# Patient Record
Sex: Female | Born: 1990
Health system: Southern US, Community
[De-identification: ages and names within clinical notes are randomized; demographics above are authoritative.]

## PROBLEM LIST (undated history)

## (undated) DIAGNOSIS — L309 Dermatitis, unspecified: Secondary | ICD-10-CM

## (undated) DIAGNOSIS — D573 Sickle-cell trait: Secondary | ICD-10-CM

## (undated) DIAGNOSIS — L509 Urticaria, unspecified: Secondary | ICD-10-CM

## (undated) DIAGNOSIS — J45909 Unspecified asthma, uncomplicated: Secondary | ICD-10-CM

## (undated) HISTORY — DX: Dermatitis, unspecified: L30.9

## (undated) HISTORY — DX: Urticaria, unspecified: L50.9

## (undated) HISTORY — DX: Sickle-cell trait: D57.3

## (undated) HISTORY — PX: HERNIA REPAIR: SHX51

## (undated) HISTORY — DX: Unspecified asthma, uncomplicated: J45.909

---
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
## 2012-01-04 ENCOUNTER — Other Ambulatory Visit: Payer: BC Managed Care – PPO

## 2012-01-24 ENCOUNTER — Encounter: Payer: BC Managed Care – PPO | Admitting: Obstetrics and Gynecology

## 2014-10-20 LAB — CYTOLOGY - PAP: Pap: NEGATIVE

## 2014-11-19 ENCOUNTER — Telehealth: Payer: Self-pay

## 2014-11-19 ENCOUNTER — Ambulatory Visit (INDEPENDENT_AMBULATORY_CARE_PROVIDER_SITE_OTHER): Payer: 59 | Admitting: Sports Medicine

## 2014-11-19 VITALS — BP 132/80 | HR 80 | Temp 98.3°F | Resp 17 | Ht 63.0 in | Wt 257.6 lb

## 2014-11-19 DIAGNOSIS — G44211 Episodic tension-type headache, intractable: Secondary | ICD-10-CM

## 2014-11-19 DIAGNOSIS — J309 Allergic rhinitis, unspecified: Secondary | ICD-10-CM | POA: Insufficient documentation

## 2014-11-19 DIAGNOSIS — J45909 Unspecified asthma, uncomplicated: Secondary | ICD-10-CM

## 2014-11-19 DIAGNOSIS — J4520 Mild intermittent asthma, uncomplicated: Secondary | ICD-10-CM | POA: Insufficient documentation

## 2014-11-19 MED ORDER — PROMETHAZINE HCL 25 MG/ML IJ SOLN
25.0000 mg | Freq: Once | INTRAMUSCULAR | Status: AC
  Administered 2014-11-19: 25 mg via INTRAMUSCULAR

## 2014-11-19 MED ORDER — KETOROLAC TROMETHAMINE 30 MG/ML IJ SOLN
30.0000 mg | Freq: Once | INTRAMUSCULAR | Status: AC
  Administered 2014-11-19: 30 mg via INTRAMUSCULAR

## 2014-11-19 NOTE — Progress Notes (Signed)
Angelica HagemanKeyana Crosby - 24 y.o. female MRN [id number]  Date of birth: 01-12-1991  SUBJECTIVE:  Including CC & ROS.  Headaches: Onset of headaches: 1.5 weeks ago  Frequency of headaches: rarely gets HA she has never had one this bad before Location of headaches: frontal and occipital region Intensity of headaches: 8/10 gradually increase over the weeka Headache has been coming and going 1 or 2 episodes a days lasting for 30-1 hours. Then stop for 8-24 hours. Twice watching up at night with headache. Patient has been treating with NSAIDS that do help.  Complaints of aura: nausea with onset no vomiting, Visual aura of flashing lights or dark spots some lines in vision, No blurred or double vision  Known triggers of headache no known trigger occuring mid day mostly at work Risk factors: Family history of migraines Mother, obesity yes, overuse of OTCs ibuprofen 800mg  once a day helps coming back after, sleep disturbances normal sleep,  visual disturbances none, history of head trauma no, frequent caffeine use no Aggravated by physical activity no Associated symptoms of nausea yes, vomiting no, phonophobia and photophobia no   ROS:  Constitutional:  No fever, chills, or fatigue.  Respiratory:  No shortness of breath, cough, or wheezing Cardiovascular:  No palpitations, chest pain or syncope Gastrointestinal:  No nausea, no abdominal pain Review of systems otherwise negative except for what is stated in HPI  HISTORY: Past Medical, Surgical, Social, and Family History Reviewed & Updated per EMR. Pertinent Historical Findings include: Seasonal allergies and mild intermittent asthma  PHYSICAL EXAM:  VS: BP:132/80 mmHg  HR:80bpm  TEMP:98.3 F (36.8 C)(Oral)  RESP:98 %  HT:5\' 3"  (160 cm)   WT:257 lb 9.6 oz (116.847 kg)  BMI:45.7 PHYSICAL EXAM: General:  Alert and oriented, No acute distress.   HENT:  Normocephalic, Oral mucosa is moist.  Pupils equal reactive to light and accommodating. Normal ocular  motion in all directions. Normal vertical and horizontal saccades and no nystagmus. Normal convergence with no deficits. Normal finger to nose. Normal visual acuity. Normal retinal exam with no papilledema Respiratory:  Lungs are clear to auscultation, Respirations are non-labored, Symmetrical chest wall expansion.   Cardiovascular:  Normal rate, Regular rhythm, No murmur, Good pulses equal in all extremities, No edema.   Gastrointestinal:  Soft, Non-tender, Non-distended, Normal bowel sounds, No organomegaly.   Integumentary:  Warm, Dry, No rash.   Neurologic:  Alert, Oriented, No focal defects. Normal cranial nerves II through XII intact. upper and lower extremity strength testing sensation intact Psychiatric:  Cooperative, Appropriate mood & affect.    ASSESSMENT & PLAN:  Impression: Tension headache, intractable  Recommendations: -No red flags indicating possible stroke, pseudotumor cerebri, cluster headache, migraine headache, or head trauma with concussion. -Advised patient that there is no obvious triggers for her headache and it would be helpful for her to keep a headache diary to help classify the trigger of her symptoms. -To treat her acute headache today will treat patient with IM Toradol 30 mg and Phenergan 25 mg. Patient will receive a ride home from her sister. -First patient to sleep throughout the evening tonight and possibly to rest tomorrow. If symptoms continue despite up in office for reassessment and possible referral for slit-lamp exam.  Headache counseling recommendations: -Recommended eliminating over-the-counter abortive therapy such as ibuprofen and Tylenol as he skin causes frequent rebound headaches.  Encourage patients to only use these as needed and infrequently using them more than once or twice a day the rebound headaches are more likely  to occur. -Recommended eliminating caffeine use as this can be a common trigger for headaches -Recommended getting adequate  sleep at night as poor sleep can trigger headaches -Recommended maintaining a migraine headache diary so that patterns of potential triggers can be identified

## 2014-11-19 NOTE — Patient Instructions (Signed)
Headache counseling recommendations: -Recommended eliminating over-the-counter abortive therapy such as ibuprofen and Tylenol as he skin causes frequent rebound headaches.  Encourage patients to only use these as needed and infrequently using them more than once or twice a day the rebound headaches are more likely to occur. -Recommended eliminating caffeine use as this can be a common trigger for headaches -Recommended getting adequate sleep at night as poor sleep can trigger headaches -Recommended maintaining a migraine headache diary so that patterns of potential triggers can be identified

## 2015-04-13 NOTE — Telephone Encounter (Signed)
No mgs

## 2015-08-14 ENCOUNTER — Ambulatory Visit (INDEPENDENT_AMBULATORY_CARE_PROVIDER_SITE_OTHER): Payer: BLUE CROSS/BLUE SHIELD | Admitting: Family Medicine

## 2015-08-14 VITALS — BP 122/84 | HR 97 | Temp 99.8°F | Resp 18 | Wt 266.6 lb

## 2015-08-14 DIAGNOSIS — J101 Influenza due to other identified influenza virus with other respiratory manifestations: Secondary | ICD-10-CM | POA: Diagnosis not present

## 2015-08-14 DIAGNOSIS — J029 Acute pharyngitis, unspecified: Secondary | ICD-10-CM | POA: Diagnosis not present

## 2015-08-14 DIAGNOSIS — R6889 Other general symptoms and signs: Secondary | ICD-10-CM

## 2015-08-14 LAB — POCT INFLUENZA A/B
Influenza A, POC: POSITIVE — AB
Influenza B, POC: NEGATIVE

## 2015-08-14 LAB — POCT RAPID STREP A (OFFICE): Rapid Strep A Screen: NEGATIVE

## 2015-08-14 MED ORDER — HYDROCODONE-HOMATROPINE 5-1.5 MG/5ML PO SYRP: 5.0000 mL | ORAL_SOLUTION | Freq: Every evening | ORAL | Status: DC | PRN

## 2015-08-14 MED ORDER — OSELTAMIVIR PHOSPHATE 75 MG PO CAPS: 75.0000 mg | ORAL_CAPSULE | Freq: Two times a day (BID) | ORAL | Status: DC

## 2015-08-14 NOTE — Progress Notes (Signed)
      Chief Complaint:  Chief Complaint  Patient presents with  . Flu like symptoms    x2 days    HPI: Angelica Crosby is a 25 y.o. female who reports to [facility] today complaining of 2 day history of flu like sxs , fatigue and soughing, sinu pressure, no fevers or chills , +/- msk aches. Di dnot get flu vaccine. Has tried otc meds without relief, some thing called best defense vitamins. She has ahd flu befpore and feels like it.   History reviewed. No pertinent past medical history. History reviewed. No pertinent past surgical history. Social History   Social History  . Marital Status: Single    Spouse Name: N/A  . Number of Children: N/A  . Years of Education: N/A   Social History Main Topics  . Smoking status: Never Smoker   . Smokeless tobacco: None  . Alcohol Use: None  . Drug Use: None  . Sexual Activity: Not Asked   Other Topics Concern  . None   Social History Narrative   History reviewed. No pertinent family history. No Known Allergies Prior to Admission medications   Not on File     ROS: The patient denies fevers, chills, night sweats, unintentional weight loss, chest pain, palpitations, wheezing, dyspnea on exertion, nausea, vomiting, abdominal pain, dysuria, hematuria, melena, numbness, or tingling.   All other systems have been reviewed and were otherwise negative with the exception of those mentioned in the HPI and as above.    PHYSICAL EXAM: Filed Vitals:   08/14/15 1548  BP: 122/84  Pulse: 97  Temp: 99.8 F (37.7 C)  Resp: 18   Body mass index is 47.24 kg/(m^2).   General: Alert, no acute distress HEENT:  Normocephalic, atraumatic, oropharynx patent. EOMI, PERRLA Erythematous throat, no exudates, TM normal, + frontal sinus tenderness, + erythematous/boggy nasal mucosa Cardiovascular:  Regular rate and rhythm, no rubs murmurs or gallops.  No Carotid bruits, radial pulse intact. No pedal edema.  Respiratory: Clear to auscultation bilaterally.  No  wheezes, rales, or rhonchi.  No cyanosis, no use of accessory musculature Abdominal: No organomegaly, abdomen is soft and non-tender, positive bowel sounds. No masses. Skin: No rashes. Neurologic: Facial musculature symmetric. Psychiatric: Patient acts appropriately throughout our interaction. Lymphatic: No cervical or submandibular lymphadenopathy Musculoskeletal: Gait intact. No edema, tenderness   LABS: Results for orders placed or performed in visit on 08/14/15  POCT Influenza A/B  Result Value Ref Range   Influenza A, POC Positive (A) Negative   Influenza B, POC Negative Negative  POCT rapid strep A  Result Value Ref Range   Rapid Strep A Screen Negative Negative     EKG/XRAY:   Primary read interpreted by Dr. Conley Rolls at [facility].   ASSESSMENT/PLAN: Encounter Diagnoses  Name Primary?  . Acute pharyngitis, unspecified pharyngitis type Yes  . Flu-like symptoms   . Influenza A    Rx tamiflu Rx hycodan, did not want any tessalone perels, will get otc daytime cough meds Work note given  Fu prn   Gross sideeffects, risk and benefits, and alternatives of medications d/w patient. Patient is aware that all medications have potential sideeffects and we are unable to predict every sideeffect or drug-drug interaction that may occur.  Doni Widmer DO  08/14/2015 6:07 PM

## 2015-08-14 NOTE — Patient Instructions (Signed)
Influenza, Adult Influenza (flu) is an infection in the mouth, nose, and throat (respiratory tract) caused by a virus. The flu can make you feel very ill. Influenza spreads easily from person to person (contagious).  HOME CARE   Only take medicines as told by your doctor.  Use a cool mist humidifier to make breathing easier.  Get plenty of rest until your fever goes away. This usually takes 3 to 4 days.  Drink enough fluids to keep your pee (urine) clear or pale yellow.  Cover your mouth and nose when you cough or sneeze.  Wash your hands well to avoid spreading the flu.  Stay home from work or school until your fever has been gone for at least 1 full day.  Get a flu shot every year. GET HELP RIGHT AWAY IF:   You have trouble breathing or feel short of breath.  Your skin or nails turn blue.  You have severe neck pain or stiffness.  You have a severe headache, facial pain, or earache.  Your fever gets worse or keeps coming back.  You feel sick to your stomach (nauseous), throw up (vomit), or have watery poop (diarrhea).  You have chest pain.  You have a deep cough that gets worse, or you cough up more thick spit (mucus). MAKE SURE YOU:   Understand these instructions.  Will watch your condition.  Will get help right away if you are not doing well or get worse.   This information is not intended to replace advice given to you by your health care provider. Make sure you discuss any questions you have with your health care provider.   Document Released: 03/15/2008 Document Revised: 06/27/2014 Document Reviewed: 09/05/2011 Elsevier Interactive Patient Education 2016 Elsevier Inc.  

## 2015-12-05 ENCOUNTER — Ambulatory Visit (INDEPENDENT_AMBULATORY_CARE_PROVIDER_SITE_OTHER): Payer: BLUE CROSS/BLUE SHIELD | Admitting: Emergency Medicine

## 2015-12-05 VITALS — BP 122/80 | HR 75 | Temp 97.9°F | Resp 12 | Ht 62.0 in | Wt 267.0 lb

## 2015-12-05 DIAGNOSIS — L02429 Furuncle of limb, unspecified: Secondary | ICD-10-CM

## 2015-12-05 MED ORDER — CLINDAMYCIN HCL 300 MG PO CAPS: 300.0000 mg | ORAL_CAPSULE | Freq: Three times a day (TID) | ORAL | Status: DC

## 2015-12-05 NOTE — Patient Instructions (Addendum)
Apply warm compresses elbow. If you have worsening of symptoms of progressive swelling please return to clinic    IF you received an x-ray today, you will receive an invoice from [facility]. Please contact [facility] at [phone] with questions or concerns regarding your invoice.   IF you received labwork today, you will receive an invoice from [facility]. Please contact [facility] at [phone] with questions or concerns regarding your invoice.   Our billing staff will not be able to assist you with questions regarding bills from these companies.  You will be contacted with the lab results as soon as they are available. The fastest way to get your results is to activate your My Chart account. Instructions are located on the last page of this paperwork. If you have not heard from us regarding the results in 2 weeks, please contact this office.     Abscess An abscess is an infected area that contains a collection of pus and debris.It can occur in almost any part of the body. An abscess is also known as a furuncle or boil. CAUSES  An abscess occurs when tissue gets infected. This can occur from blockage of oil or sweat glands, infection of hair follicles, or a minor injury to the skin. As the body tries to fight the infection, pus collects in the area and creates pressure under the skin. This pressure causes pain. People with weakened immune systems have difficulty fighting infections and get certain abscesses more often.  SYMPTOMS Usually an abscess develops on the skin and becomes a painful mass that is red, warm, and tender. If the abscess forms under the skin, you may feel a moveable soft area under the skin. Some abscesses break open (rupture) on their own, but most will continue to get worse without care. The infection can spread deeper into the body and eventually into the bloodstream, causing you to feel ill.  DIAGNOSIS  Your caregiver  will take your medical history and perform a physical exam. A sample of fluid may also be taken from the abscess to determine what is causing your infection. TREATMENT  Your caregiver may prescribe antibiotic medicines to fight the infection. However, taking antibiotics alone usually does not cure an abscess. Your caregiver may need to make a small cut (incision) in the abscess to drain the pus. In some cases, gauze is packed into the abscess to reduce pain and to continue draining the area. HOME CARE INSTRUCTIONS   Only take over-the-counter or prescription medicines for pain, discomfort, or fever as directed by your caregiver.  If you were prescribed antibiotics, take them as directed. Finish them even if you start to feel better.  If gauze is used, follow your caregiver's directions for changing the gauze.  To avoid spreading the infection:  Keep your draining abscess covered with a bandage.  Wash your hands well.  Do not share personal care items, towels, or whirlpools with others.  Avoid skin contact with others.  Keep your skin and clothes clean around the abscess.  Keep all follow-up appointments as directed by your caregiver. SEEK MEDICAL CARE IF:   You have increased pain, swelling, redness, fluid drainage, or bleeding.  You have muscle aches, chills, or a general ill feeling.  You have a fever. MAKE SURE YOU:   Understand these instructions.  Will watch your condition.  Will get help right away if you are not doing well or get worse.   This information is not intended to replace  advice given to you by your health care provider. Make sure you discuss any questions you have with your health care provider.   Document Released: 03/16/2005 Document Revised: 12/06/2011 Document Reviewed: 08/19/2011 Elsevier Interactive Patient Education [employer].

## 2015-12-05 NOTE — Progress Notes (Signed)
By signing my name below, I, Mesha Guinyard, attest that this documentation has been prepared under the direction and in the presence of Lesle ChrisSteven Shariah Assad, MD.  Electronically Signed: Arvilla MarketMesha Guinyard, Medical Scribe. 12/05/2015. 8:36 AM.  Chief Complaint:  Chief Complaint  Patient presents with  . Recurrent Skin Infections    boil on left elbow, 2days swollen and red    HPI: Angelica Crosby is a 25 y.o. female [facility] complaining of nodule on top of her left elbow 2 days ago. The nodule is described as erythematous, swollen, and painful. Pt doesn't remember getting bit. Pt has had a boil on her thigh and her arm in the past, but never on her elbow. Pt report having a fever today.  Pt is sexually active and wears condoms for birth control. Pt denies possibility of being pregnant. LMP was nl last week. Pt works at [employer].  Past Medical History  Diagnosis Date  . Asthma    Past Surgical History  Procedure Laterality Date  . Hernia repair     Social History   Social History  . Marital Status: Single    Spouse Name: N/A  . Number of Children: N/A  . Years of Education: N/A   Social History Main Topics  . Smoking status: Never Smoker   . Smokeless tobacco: None  . Alcohol Use: None  . Drug Use: None  . Sexual Activity: Not Asked   Other Topics Concern  . None   Social History Narrative   No family history on file. No Known Allergies Prior to Admission medications   Medication Sig Start Date End Date Taking? Authorizing Provider  albuterol (PROVENTIL, VENTOLIN) (5 MG/ML) 0.5% NEBU Take 2.5 mg/hr by nebulization continuous.   Yes Historical Provider, MD     ROS: The patient denies chills, night sweats, unintentional weight loss, chest pain, palpitations, wheezing, dyspnea on exertion, nausea, vomiting, abdominal pain, dysuria, hematuria, melena, numbness, weakness, or tingling. +fevers  All other systems have been reviewed and were  otherwise negative with the exception of those mentioned in the HPI and as above.    PHYSICAL EXAM: Filed Vitals:   12/05/15 0829  BP: 122/80  Pulse: 75  Temp: 97.9 F (36.6 C)  Resp: 12   Body mass index is 48.82 kg/(m^2).   General: Alert & cooperative, no acute distress HEENT:  Normocephalic, atraumatic, oropharynx patent. Eye: [facility] Cardiovascular:  Regular rate and rhythm, no rubs murmurs or gallops.  No Carotid bruits, radial pulse intact. No pedal edema.  Respiratory: Clear to auscultation bilaterally.  No wheezes, rales, or rhonchi.  No cyanosis, no use of accessory musculature Abdominal: No organomegaly, abdomen is soft and non-tender, positive bowel sounds.  No masses. Musculoskeletal: Gait intact. No edema, tenderness Skin: No rashes. 1x1 cm tender firm nodule over the left olecranon with mild redness around nodule this does not appear to be an olecranon bursitus Neurologic: Facial musculature symmetric. Psychiatric: Patient acts appropriately throughout our interaction. Lymphatic: No cervical or submandibular lymphadenopathy  LABS:    EKG/XRAY:   [facility]   ASSESSMENT/PLAN: This did not appear to be an olecranon bursitis. She has a 1 x 1 cm firm area over the olecranon bursa with some surrounding redness and increased warmth. This would be consistent with a boil. She has a history of boils. Because she is a young female will treat with Cleocin. Instructions given to return to clinic if  worsening.I personally performed the services described in this documentation, which was scribed in my presence. The recorded information has been reviewed and is accurate.   Gross sideeffects, risk and benefits, and alternatives of medications d/w patient. Patient is aware that all medications have potential sideeffects and we are unable to predict every sideeffect or drug-drug interaction that may occur.  Lesle Chris MD 12/05/2015 8:36  AM

## 2016-01-08 ENCOUNTER — Ambulatory Visit (INDEPENDENT_AMBULATORY_CARE_PROVIDER_SITE_OTHER): Payer: BLUE CROSS/BLUE SHIELD | Admitting: Family Medicine

## 2016-01-08 VITALS — BP 122/76 | HR 87 | Temp 97.6°F | Resp 20 | Ht 62.0 in | Wt 263.0 lb

## 2016-01-08 DIAGNOSIS — Z7251 High risk heterosexual behavior: Secondary | ICD-10-CM | POA: Diagnosis not present

## 2016-01-08 DIAGNOSIS — Z0000 Encounter for general adult medical examination without abnormal findings: Secondary | ICD-10-CM | POA: Diagnosis not present

## 2016-01-08 DIAGNOSIS — B373 Candidiasis of vulva and vagina: Secondary | ICD-10-CM

## 2016-01-08 DIAGNOSIS — N898 Other specified noninflammatory disorders of vagina: Secondary | ICD-10-CM

## 2016-01-08 DIAGNOSIS — N3090 Cystitis, unspecified without hematuria: Secondary | ICD-10-CM | POA: Diagnosis not present

## 2016-01-08 DIAGNOSIS — J309 Allergic rhinitis, unspecified: Secondary | ICD-10-CM | POA: Diagnosis not present

## 2016-01-08 DIAGNOSIS — R05 Cough: Secondary | ICD-10-CM | POA: Diagnosis not present

## 2016-01-08 DIAGNOSIS — R059 Cough, unspecified: Secondary | ICD-10-CM

## 2016-01-08 DIAGNOSIS — B3731 Acute candidiasis of vulva and vagina: Secondary | ICD-10-CM

## 2016-01-08 LAB — POCT CBC
Granulocyte percent: 58.9 %G (ref 37–80)
HCT, POC: 41.3 % (ref 37.7–47.9)
Hemoglobin: 14 g/dL (ref 12.2–16.2)
Lymph, poc: 1.8 (ref 0.6–3.4)
MCH, POC: 26 pg — AB (ref 27–31.2)
MCHC: 33.8 g/dL (ref 31.8–35.4)
MCV: 76.9 fL — AB (ref 80–97)
MID (cbc): 0.5 (ref 0–0.9)
MPV: 6.8 fL (ref 0–99.8)
POC Granulocyte: 3.3 (ref 2–6.9)
POC LYMPH PERCENT: 32.1 %L (ref 10–50)
POC MID %: 9 %M (ref 0–12)
Platelet Count, POC: 334 10*3/uL (ref 142–424)
RBC: 5.37 M/uL (ref 4.04–5.48)
RDW, POC: 15.2 %
WBC: 5.6 10*3/uL (ref 4.6–10.2)

## 2016-01-08 LAB — POCT URINE PREGNANCY: Preg Test, Ur: NEGATIVE

## 2016-01-08 LAB — POC MICROSCOPIC URINALYSIS (UMFC): Mucus: ABSENT

## 2016-01-08 LAB — POCT URINALYSIS DIP (MANUAL ENTRY)
Bilirubin, UA: NEGATIVE
Glucose, UA: NEGATIVE
Ketones, POC UA: NEGATIVE
Nitrite, UA: NEGATIVE
Protein Ur, POC: NEGATIVE
Spec Grav, UA: 1.02
Urobilinogen, UA: 0.2
pH, UA: 6

## 2016-01-08 LAB — POCT WET + KOH PREP
Trich by wet prep: ABSENT
Yeast by wet prep: ABSENT

## 2016-01-08 LAB — POCT GLYCOSYLATED HEMOGLOBIN (HGB A1C): Hemoglobin A1C: 5.8

## 2016-01-08 LAB — GLUCOSE, POCT (MANUAL RESULT ENTRY): POC Glucose: 97 mg/dl (ref 70–99)

## 2016-01-08 MED ORDER — FLUCONAZOLE 150 MG PO TABS: 150.0000 mg | ORAL_TABLET | Freq: Once | ORAL | Status: DC

## 2016-01-08 MED ORDER — CEPHALEXIN 500 MG PO CAPS: 500.0000 mg | ORAL_CAPSULE | Freq: Two times a day (BID) | ORAL | Status: DC

## 2016-01-08 MED ORDER — MONTELUKAST SODIUM 10 MG PO TABS: 10.0000 mg | ORAL_TABLET | Freq: Every day | ORAL | Status: DC

## 2016-01-08 NOTE — Progress Notes (Signed)
Patient ID: Angelica HagemanKeyana Crosby, female    DOB: 1990/09/26  Age: 25 y.o. MRN: [id number]  Chief Complaint  Patient presents with  . Annual Exam    With Pap  . Sinus Problem    Subjective:   Patient is here for a physical examination.  She had unprotected sex weeks ago. They did use a condom initially, then he took it off and they continued having sex. She has had about 10 partners in her lifetime. No pregnancies. No history of STDs. It was this and this encounter that encouraged her to come on in for a physical today also. She is worried that she might pick up something. She has had some vaginal irritation.  Past medical history: Medications: None Allergies: Multiple environmental allergies including animals, pollens, dust, etc. practices she put down" everything" on the form) Medical history: Allergies, asthma, sickle cell trait Surgeries: Hernia repair in infancy Last menstrual cycle about 12 days ago  Family history: Mother father Angelica Crosby are living and well with no major illnesses. There is cancer, diabetes, stroke, and minimal disease in her grandparents.  Social history: Does not smoke. Drinks about once a week. Does not use drugs. She is single, sexually involved with men only. 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 She has a associate degree and one more year of college.  Review of systems: Constitutional: She's had some chills sweating and fever. She HEENT includes congestion and sinus pressure and sneezing and sore throat. Eyes unremarkable Respiratory: Cough Cardiovascular: Unremarkable Gastrointestinal: Diarrhea : Increased urination Genitourinary has urinary frequency, vaginal discharge, vaginal pain. Musculoskeletal unremarkable Dermatologic: Unremarkable Allergy/immunology: Seasonal allergies Neurologic: Headaches Hematologic: Unremarkable Psychiatric: Unremarkable   Current allergies, medications, problem list, past/family and social  histories reviewed.  Objective:  BP 122/76 mmHg  Pulse 87  Temp(Src) 97.6 F (36.4 C) (Oral)  Resp 20  Ht 5\' 2"  (1.575 m)  Wt 263 lb (119.296 kg)  BMI 48.09 kg/m2  SpO2 100%  LMP 12/30/2015  Physical exam: Overweight obese young lady in no major acute distress. TMs normal. Eyes PERRLA. Throat clear. Neck supple without nodes or thyromegaly. Teeth look good. Chest clear to auscultation. Heart regular without murmurs. Breasts not examined, denies any issues. Abdomen soft without mass or tenderness. Normal female external genitalia. Labia separate readily. No lesions noted externally. Normal vaginal mucosa. Has some cheesy discharge. Wet prep taken. She bled a little bit with doing the Pap test. Bimanual unremarkable. Extremities without lesions or rashes. Skin unremarkable.  Assessment & Plan:   Assessment: 1. Annual physical exam   2. Unprotected sexual intercourse   3. Morbid obesity, unspecified obesity type (HCC)   4. Vaginal discharge   5. Allergic rhinitis, unspecified allergic rhinitis type   6. Cough   7. Cystitis       Plan: Annual physical plus sinusitis problems and the issue of her sexual exposure.  Orders Placed This Encounter  Procedures  . HIV antibody  . RPR  . COMPLETE METABOLIC PANEL WITH GFR  . TSH  . POCT CBC  . POCT urine pregnancy  . POCT urinalysis dipstick  . POCT Microscopic Urinalysis (UMFC)  . POCT Wet + KOH Prep  . POCT glycosylated hemoglobin (Hb A1C)  . POCT glucose (manual entry)    Meds ordered this encounter  Medications  . Cetirizine HCl (ZYRTEC PO)    Sig: Take by mouth.  . montelukast (SINGULAIR) 10 MG tablet    Sig: Take 1 tablet (10  mg total) by mouth at bedtime.    Dispense:  30 tablet    Refill:  3  . cephALEXin (KEFLEX) 500 MG capsule    Sig: Take 1 capsule (500 mg total) by mouth 2 (two) times daily.    Dispense:  10 capsule    Refill:  0    Results for orders placed or performed in visit on 01/08/16  POCT CBC   Result Value Ref Range   WBC 5.6 4.6 - 10.2 K/uL   Lymph, poc 1.8 0.6 - 3.4   POC LYMPH PERCENT 32.1 10 - 50 %L   MID (cbc) 0.5 0 - 0.9   POC MID % 9.0 0 - 12 %M   POC Granulocyte 3.3 2 - 6.9   Granulocyte percent 58.9 37 - 80 %G   RBC 5.37 4.04 - 5.48 M/uL   Hemoglobin 14.0 12.2 - 16.2 g/dL   HCT, POC 81.1 91.4 - 47.9 %   MCV 76.9 (A) 80 - 97 fL   MCH, POC 26.0 (A) 27 - 31.2 pg   MCHC 33.8 31.8 - 35.4 g/dL   RDW, POC 78.2 %   Platelet Count, POC 334 142 - 424 K/uL   MPV 6.8 0 - 99.8 fL  POCT urine pregnancy  Result Value Ref Range   Preg Test, Ur Negative Negative  POCT urinalysis dipstick  Result Value Ref Range   Color, UA yellow yellow   Clarity, UA hazy (A) clear   Glucose, UA negative negative   Bilirubin, UA negative negative   Ketones, POC UA negative negative   Spec Grav, UA 1.020    Blood, UA trace-lysed (A) negative   pH, UA 6.0    Protein Ur, POC negative negative   Urobilinogen, UA 0.2    Nitrite, UA Negative Negative   Leukocytes, UA moderate (2+) (A) Negative  POCT Microscopic Urinalysis (UMFC)  Result Value Ref Range   WBC,UR,HPF,POC Moderate (A) None WBC/hpf   RBC,UR,HPF,POC None None RBC/hpf   Bacteria Moderate (A) None, Too numerous to count   Mucus Absent Absent   Epithelial Cells, UR Per Microscopy Moderate (A) None, Too numerous to count cells/hpf  POCT Wet + KOH Prep  Result Value Ref Range   Yeast by KOH Present Present, Absent   Yeast by wet prep Absent Present, Absent   WBC by wet prep Moderate (A) None, Few, Too numerous to count   Clue Cells Wet Prep HPF POC Few (A) None, Too numerous to count   Trich by wet prep Absent Present, Absent   Bacteria Wet Prep HPF POC Moderate (A) None, Few, Too numerous to count   Epithelial Cells By [employer] (UMFC) Many (A) None, Few, Too numerous to count   RBC,UR,HPF,POC None None RBC/hpf  POCT glycosylated hemoglobin (Hb A1C)  Result Value Ref Range   Hemoglobin A1C 5.8   POCT glucose (manual entry)   Result Value Ref Range   POC Glucose 97 70 - 99 mg/dl        Patient Instructions   Drink plenty of water to keep your bladder well flushed out  Take the antibiotic cephalexin one twice daily for urinary infection   If you choose to have sex, use detection 100% of the time  If you do not have a. When you're scheduled to, you should check another pregnancy test.  Once you have had a menstrual period and no you are not pregnant you should consider going on a birth control, such as birth  control pills or an implant. The safest thing is to not have sexual involvement until you are in a lifelong 1 man/1 woman relationship, and if you're choosing to be sexually involved it is better to go ahead and be on something to prevent pregnancy.  You need to work hard on trying to eat less and exercise more to lose weight  We will let you know the remainder of your labs in a few days.  Take the Zyrtec as you have been.  Take Singulair (montelukast) 1 daily for your allergies and asthma    IF you received an x-ray today, you will receive an invoice from [facility]. Please contact [facility] at [phone] with questions or concerns regarding your invoice.   IF you received labwork today, you will receive an invoice from [facility]. Please contact [facility] at [phone] with questions or concerns regarding your invoice.   Our billing staff will not be able to assist you with questions regarding bills from these companies.  You will be contacted with the lab results as soon as they are available. The fastest way to get your results is to activate your My Chart account. Instructions are located on the last page of this paperwork. If you have not heard from [place] regarding the results in 2 weeks, please contact this office.         No Follow-up on file.   HOPPER,DAVID, MD 01/08/2016

## 2016-01-08 NOTE — Progress Notes (Signed)
Patient is here complaining of her sinuses and congestion. Also has dysuria  Subjective: For the past few days especially she's had a lot of sinus congestion and coughing. She coughs up some greenish mucus. Most of what she blows her nose is clear. She has not been running a fever.  Objective: TMs are normal. Throat clear. Neck supple without nodes. Chest clear to auscultation. Her nose is very stuffy.  Assessment: Urinary tract infection Probably bad allergic issues with the current environmental air alerts  Plan: Treat symptomatically. Singulair for allergy and asthma Cephalexin for the UTI

## 2016-01-08 NOTE — Patient Instructions (Addendum)
Drink plenty of water to keep your bladder well flushed out  Take the antibiotic cephalexin one twice daily for urinary infection   If you choose to have sex, use detection 100% of the time  If you do not have a. When you're scheduled to, you should check another pregnancy test.  Once you have had a menstrual period and no you are not pregnant you should consider going on a birth control, such as birth control pills or an implant. The safest thing is to not have sexual involvement until you are in a lifelong 1 man/1 woman relationship, and if you're choosing to be sexually involved it is better to go ahead and be on something to prevent pregnancy. You can call the office and make an appointment with the PA Sarah or with Dr. Norberto SorensonEva Shaw, both of whom can put in implants.  You need to work hard on trying to eat less and exercise more to lose weight  We will let you know the remainder of your labs in a few days.  Take the Zyrtec as you have been.  Take Singulair (montelukast) 1 daily for your allergies and asthma  Take Diflucan single dose pill for yeast tinnitus    IF you received an x-ray today, you will receive an invoice from [facility]. Please contact [facility] at [phone] with questions or concerns regarding your invoice.   IF you received labwork today, you will receive an invoice from [facility]. Please contact [facility] at [phone] with questions or concerns regarding your invoice.   Our billing staff will not be able to assist you with questions regarding bills from these companies.  You will be contacted with the lab results as soon as they are available. The fastest way to get your results is to activate your My Chart account. Instructions are located on the last page of this paperwork. If you have not heard from us regarding the results in 2 weeks, please contact this office.

## 2016-01-09 LAB — TSH: TSH: 1.93 mIU/L

## 2016-01-09 LAB — COMPLETE METABOLIC PANEL WITH GFR
ALT: 10 U/L (ref 6–29)
AST: 15 U/L (ref 10–30)
Albumin: 4.1 g/dL (ref 3.6–5.1)
Alkaline Phosphatase: 148 U/L — ABNORMAL HIGH (ref 33–115)
BUN: 11 mg/dL (ref 7–25)
CO2: 29 mmol/L (ref 20–31)
Calcium: 9.4 mg/dL (ref 8.6–10.2)
Chloride: 104 mmol/L (ref 98–110)
Creat: 0.97 mg/dL (ref 0.50–1.10)
GFR, Est African American: >89 mL/min (ref >=60)
GFR, Est Non African American: 83 mL/min (ref >=60)
Glucose, Bld: 96 mg/dL (ref 65–99)
Potassium: 4.5 mmol/L (ref 3.5–5.3)
Sodium: 142 mmol/L (ref 135–146)
Total Bilirubin: 0.2 mg/dL (ref 0.2–1.2)
Total Protein: 7.2 g/dL (ref 6.1–8.1)

## 2016-01-09 LAB — HIV ANTIBODY (ROUTINE TESTING W REFLEX): HIV 1&2 Ab, 4th Generation: NONREACTIVE

## 2016-01-10 LAB — URINE CULTURE

## 2016-01-11 ENCOUNTER — Telehealth: Payer: Self-pay | Admitting: Emergency Medicine

## 2016-01-11 NOTE — Telephone Encounter (Signed)
Attempted to reach pt with results Mailbox full

## 2016-01-11 NOTE — Telephone Encounter (Signed)
-----   Message from Peyton Najjar, MD sent at 01/11/2016  8:16 AM EDT ----- Call patient: Labs normal Urine grew strept infection which should respond well to the antibiotic she is taking.

## 2016-01-12 ENCOUNTER — Encounter: Payer: Self-pay | Admitting: *Deleted

## 2016-01-12 LAB — PAP IG, CT-NG, RFX HPV ASCU
Chlamydia Probe Amp: DETECTED — AB
GC Probe Amp: NOT DETECTED

## 2016-01-12 LAB — RPR

## 2016-02-02 ENCOUNTER — Encounter: Payer: Self-pay | Admitting: Family Medicine

## 2016-02-02 ENCOUNTER — Ambulatory Visit: Payer: Self-pay

## 2016-02-02 ENCOUNTER — Ambulatory Visit (INDEPENDENT_AMBULATORY_CARE_PROVIDER_SITE_OTHER): Payer: BLUE CROSS/BLUE SHIELD | Admitting: Family Medicine

## 2016-02-02 VITALS — BP 108/74 | HR 79 | Temp 98.3°F | Resp 16 | Ht 62.25 in | Wt 268.4 lb

## 2016-02-02 DIAGNOSIS — Z111 Encounter for screening for respiratory tuberculosis: Secondary | ICD-10-CM

## 2016-02-02 NOTE — Progress Notes (Signed)
  Tuberculosis Risk Questionnaire  1. No Were you born outside the [place] in one of the following parts of the world: [place], [place], [place], [place] or [place]?    2. No Have you traveled outside the [place] and lived for more than one month in one of the following parts of the world: [place], [place], [place], [place] or [place]?    3. No Do you have a compromised immune system such as from any of the following conditions:HIV/AIDS, organ or bone marrow transplantation, diabetes, immunosuppressive medicines (e.g. Prednisone, Remicaide), leukemia, lymphoma, cancer of the head or neck, gastrectomy or jejunal bypass, end-stage renal disease (on dialysis), or silicosis?     4. No Have you ever or do you plan on working in: a residential care center, a health care facility, a jail or prison or homeless shelter?    5. No Have you ever: injected illegal drugs, used crack cocaine, lived in a homeless shelter  or been in jail or prison?     6. No Have you ever been exposed to anyone with infectious tuberculosis?    Tuberculosis Symptom Questionnaire  Do you currently have any of the following symptoms?  1. No Unexplained cough lasting more than 3 weeks?   2. No Unexplained fever lasting more than 3 weeks.   3. No Night Sweats (sweating that leaves the bedclothes and sheets wet)     4. No Shortness of Breath   5. No Chest Pain   6. No Unintentional weight loss    7. No Unexplained fatigue (very tired for no reason)    Signed,   Meredith StaggersJeffrey Krista Godsil, MD [facility].  02/02/16 9:39 AM

## 2016-02-02 NOTE — Progress Notes (Signed)
Subjective:  By signing my name below, I, Stann Oresung-Kai Tsai, attest that this documentation has been prepared under the direction and in the presence of Meredith StaggersJeffrey Karie Skowron, MD. Electronically Signed: Stann Oresung-Kai Tsai, Scribe. 02/02/2016 , 9:36 AM .  Patient was seen in Room 9 .   Patient ID: Angelica Crosby, female    DOB: [birth year], 25 y.o.   MRN: [id number] Chief Complaint  Patient presents with  . PPD Reading    here for ppd for daycare   HPI Angelica Crosby is a 25 y.o. female Here for PPD reading. She had a physical on July 21st. She works at [employer] but plans to work at [employer], which requires a TB skin test. Her last TB screening was in 2014 and 2015.   Patient Active Problem List   Diagnosis Date Noted  . Allergic rhinitis 11/19/2014  . Airway hyperreactivity 11/19/2014   Past Medical History:  Diagnosis Date  . Asthma    Past Surgical History:  Procedure Laterality Date  . HERNIA REPAIR     Not on File Prior to Admission medications   Medication Sig Start Date End Date Taking? Authorizing Provider  albuterol (PROVENTIL, VENTOLIN) (5 MG/ML) 0.5% NEBU Take 2.5 mg/hr by nebulization continuous. Reported on 01/08/2016   Yes Historical Provider, MD  Cetirizine HCl (ZYRTEC PO) Take by mouth.   Yes Historical Provider, MD  montelukast (SINGULAIR) 10 MG tablet Take 1 tablet (10 mg total) by mouth at bedtime. 01/08/16  Yes Peyton Najjaravid H Hopper, MD  cephALEXin (KEFLEX) 500 MG capsule Take 1 capsule (500 mg total) by mouth 2 (two) times daily. Patient not taking: Reported on 02/02/2016 01/08/16   Peyton Najjaravid H Hopper, MD  clindamycin (CLEOCIN) 300 MG capsule Take 1 capsule (300 mg total) by mouth 3 (three) times daily. Patient not taking: Reported on 01/08/2016 12/05/15   Collene GobbleSteven A Daub, MD  fluconazole (DIFLUCAN) 150 MG tablet Take 1 tablet (150 mg total) by mouth once. Patient not taking: Reported on 02/02/2016 01/08/16   Peyton Najjaravid H Hopper, MD   Social History   Social History  . Marital status: Single    Spouse name: N/A  . Number of children: N/A  . Years of education: N/A   Occupational History  . Not on file.   Social History Main Topics  . Smoking status: Never Smoker  . Smokeless tobacco: Not on file  . Alcohol use Not on file  . Drug use: Unknown  . Sexual activity: Not on file   Other Topics Concern  . Not on file   Social History Narrative  . No narrative on file   Review of Systems  Constitutional: Negative for chills, fatigue, fever and unexpected weight change.  Respiratory: Negative for cough.   Gastrointestinal: Negative for constipation, diarrhea, nausea and vomiting.  Skin: Negative for rash and wound.  Neurological: Negative for dizziness, weakness and headaches.       Objective:   Physical Exam  Constitutional: She is oriented to person, place, and time. She appears well-developed and well-nourished. No distress.  HENT:  Head: Normocephalic and atraumatic.  Eyes: EOM are normal. Pupils are equal, round, and reactive to light.  Neck: Neck supple.  Cardiovascular: Normal rate, regular rhythm and normal heart sounds.  Exam reveals no gallop and no friction rub.   No murmur heard. Pulmonary/Chest: Effort normal and breath sounds normal. No respiratory distress.  Musculoskeletal: Normal range of motion.  Neurological: She is alert and oriented to person, place, and time.  Skin: Skin  is warm and dry. No rash noted. No erythema.  Psychiatric: She has a normal mood and affect. Her behavior is normal.  Nursing note and vitals reviewed.   Vitals:   02/02/16 0910  BP: 108/74  Pulse: 79  Resp: 16  Temp: 98.3 F (36.8 C)  TempSrc: Oral  SpO2: 99%  Weight: 268 lb 6.4 oz (121.7 kg)  Height: 5' 2.25" (1.581 m)      Assessment & Plan:    Angelica Crosby is a 25 y.o. female Screening for tuberculosis - Plan: TB Skin Test  - No concerns for active TB based on questionnaire, but due to need for actual PPD for work, this was placed today. Return in 48-72  hours to have this read.  No orders of the defined types were placed in this encounter.  Patient Instructions     IF you received an x-ray today, you will receive an invoice from [facility]. Please contact [facility] at [phone] with questions or concerns regarding your invoice.   IF you received labwork today, you will receive an invoice from [facility]. Please contact [facility] at [phone] with questions or concerns regarding your invoice.   Our billing staff will not be able to assist you with questions regarding bills from these companies.  You will be contacted with the lab results as soon as they are available. The fastest way to get your results is to activate your My Chart account. Instructions are located on the last page of this paperwork. If you have not heard from us regarding the results in 2 weeks, please contact this office.    Based on screening questionnaire, I do not have any concerns for tuberculosis, but will also place TB skin test as required by work. Return in 48-72 hours to have that test read.    I personally performed the services described in this documentation, which was scribed in my presence. The recorded information has been reviewed and considered, and addended by me as needed.   Signed,   Meredith StaggersJeffrey Jakylah Bassinger, MD [facility].  02/02/16 10:02 AM

## 2016-02-02 NOTE — Patient Instructions (Addendum)
   IF you received an x-ray today, you will receive an invoice from [facility]. Please contact [facility] at [phone] with questions or concerns regarding your invoice.   IF you received labwork today, you will receive an invoice from [facility]. Please contact [facility] at [phone] with questions or concerns regarding your invoice.   Our billing staff will not be able to assist you with questions regarding bills from these companies.  You will be contacted with the lab results as soon as they are available. The fastest way to get your results is to activate your My Chart account. Instructions are located on the last page of this paperwork. If you have not heard from us regarding the results in 2 weeks, please contact this office.    Based on screening questionnaire, I do not have any concerns for tuberculosis, but will also place TB skin test as required by work. Return in 48-72 hours to have that test read.

## 2016-02-04 ENCOUNTER — Ambulatory Visit (INDEPENDENT_AMBULATORY_CARE_PROVIDER_SITE_OTHER): Payer: BLUE CROSS/BLUE SHIELD | Admitting: Physician Assistant

## 2016-02-04 DIAGNOSIS — Z111 Encounter for screening for respiratory tuberculosis: Secondary | ICD-10-CM

## 2016-02-04 LAB — TB SKIN TEST: TB Skin Test: NEGATIVE

## 2016-02-04 NOTE — Progress Notes (Signed)
Pt. Was here for a PPD read. Results negative. Induration 0mm. Patient was given a copy of her results as well as had additional paperwork pertaining on her physical on 01/08/16 filled out. Copies were made of all paperwork.

## 2016-08-04 ENCOUNTER — Telehealth: Payer: Self-pay | Admitting: General Practice

## 2016-08-04 NOTE — Telephone Encounter (Signed)
Pt's sister,Alexia Raul Dellston  is your pt, .  Pt would like to know if you will accept her as a pt as well?

## 2016-08-05 NOTE — Telephone Encounter (Signed)
Sorry but I am too full right now  

## 2016-08-17 NOTE — Telephone Encounter (Signed)
Have called several times.  Unable to leave message due to no voice mail.

## 2017-03-23 ENCOUNTER — Emergency Department (HOSPITAL_COMMUNITY)
Admission: EM | Admit: 2017-03-23 | Discharge: 2017-03-23 | Disposition: A | Discharge status: Home / Self Care | Payer: 59 | Attending: Emergency Medicine | Admitting: Emergency Medicine

## 2017-03-23 ENCOUNTER — Encounter (HOSPITAL_COMMUNITY): Payer: Self-pay | Admitting: Emergency Medicine

## 2017-03-23 ENCOUNTER — Emergency Department (HOSPITAL_COMMUNITY): Payer: 59

## 2017-03-23 DIAGNOSIS — Z79899 Other long term (current) drug therapy: Secondary | ICD-10-CM | POA: Insufficient documentation

## 2017-03-23 DIAGNOSIS — J45909 Unspecified asthma, uncomplicated: Secondary | ICD-10-CM | POA: Insufficient documentation

## 2017-03-23 DIAGNOSIS — R0789 Other chest pain: Secondary | ICD-10-CM | POA: Insufficient documentation

## 2017-03-23 LAB — BASIC METABOLIC PANEL
Anion gap: 6 (ref 5–15)
BUN: 7 mg/dL (ref 6–20)
CO2: 27 mmol/L (ref 22–32)
Calcium: 8.9 mg/dL (ref 8.9–10.3)
Chloride: 104 mmol/L (ref 101–111)
Creatinine, Ser: 0.86 mg/dL (ref 0.44–1.00)
GFR calc Af Amer: >60 mL/min (ref >60)
GFR calc non Af Amer: >60 mL/min (ref >60)
Glucose, Bld: 101 mg/dL — ABNORMAL HIGH (ref 65–99)
Potassium: 4.4 mmol/L (ref 3.5–5.1)
Sodium: 137 mmol/L (ref 135–145)

## 2017-03-23 LAB — CBC
HCT: 39 % (ref 36.0–46.0)
Hemoglobin: 12.8 g/dL (ref 12.0–15.0)
MCH: 25.5 pg — ABNORMAL LOW (ref 26.0–34.0)
MCHC: 32.8 g/dL (ref 30.0–36.0)
MCV: 77.8 fL — ABNORMAL LOW (ref 78.0–100.0)
Platelets: 303 10*3/uL (ref 150–400)
RBC: 5.01 MIL/uL (ref 3.87–5.11)
RDW: 15 % (ref 11.5–15.5)
WBC: 5.7 10*3/uL (ref 4.0–10.5)

## 2017-03-23 LAB — I-STAT TROPONIN, ED: Troponin i, poc: 0 ng/mL (ref 0.00–0.08)

## 2017-03-23 MED ORDER — IBUPROFEN 600 MG PO TABS: 600.0000 mg | ORAL_TABLET | Freq: Four times a day (QID) | ORAL | 0 refills | Status: DC | PRN

## 2017-03-23 NOTE — ED Triage Notes (Signed)
Per pt, pt complains of chest pain for the last four days and SOB when bending over or laying down. Pt states the chest pain is aching and comes and goes throughout the day.

## 2017-03-23 NOTE — Discharge Instructions (Signed)
Take ibuprofen as needed for pain.  Return if you develop pain with eating, vomiting, fever, or coughing up blood.

## 2017-03-23 NOTE — ED Provider Notes (Signed)
[facility] Provider Note   CSN: [id number] Arrival date & time: 03/23/17  8295     History   Chief Complaint Chief Complaint  Patient presents with  . Chest Pain  . Shortness of Breath    HPI Angelica Crosby is a 26 y.o. female.  HPI   26 year old female with history of asthma presenting complaining of chest pain and shortness of breath. For the past 4 days patient has had intermittent pain to her mid chest and her right lower chest. Pain is described as a sharp shooting sensation, worse with bending over or with laying down. When the pain is intense a catch her breath away. Pain is been intermittent throughout the day. She reported occasional nonproductive cough, and feeling nauseous. She denies fever, chills, lightheadedness, dizziness, diaphoresis, vomiting, diarrhea, or rash. She does admits to heavy lifting at work but that's not new. Denies any other strenuous activities. No prior history of PE or DVT, no recent surgery, prolonged bed rest, taking oral hormone, or hemoptysis.  Past Medical History:  Diagnosis Date  . Asthma     Patient Active Problem List   Diagnosis Date Noted  . Allergic rhinitis 11/19/2014  . Airway hyperreactivity 11/19/2014    Past Surgical History:  Procedure Laterality Date  . HERNIA REPAIR      OB History    No data available       Home Medications    Prior to Admission medications   Medication Sig Start Date End Date Taking? Authorizing Provider  albuterol (PROVENTIL HFA;VENTOLIN HFA) 108 (90 Base) MCG/ACT inhaler Inhale 2 puffs into the lungs as needed for wheezing or shortness of breath.   Yes [provider]  ibuprofen (ADVIL,MOTRIN) 200 MG tablet Take 400 mg by mouth every 6 (six) hours as needed for moderate pain or cramping.   Yes [provider]  montelukast (SINGULAIR) 10 MG tablet Take 1 tablet (10 mg total) by mouth at bedtime. 01/08/16  Yes Peyton Najjar, MD    Family History History  reviewed. No pertinent family history.  Social History Social History  Substance Use Topics  . Smoking status: Never Smoker  . Smokeless tobacco: Never Used  . Alcohol use 0.0 oz/week     Allergies   Amoxicillin and Penicillins   Review of Systems Review of Systems  All other systems reviewed and are negative.    Physical Exam Updated Vital Signs BP 118/70   Pulse 69   Temp 98.5 F (36.9 C) (Oral)   Resp 17   Ht  (1.6 m)   Wt 108.9 kg (240 lb)   LMP 03/18/2017 (Exact Date)   SpO2 99%   BMI 42.51 kg/m   Physical Exam  Constitutional: She is oriented to person, place, and time. She appears well-developed and well-nourished. No distress.  Obese female resting topically, laughing, in no acute discomfort.  HENT:  Head: Atraumatic.  Right Ear: External ear normal.  Left Ear: External ear normal.  Mouth/Throat: Oropharynx is clear and moist.  Eyes: Conjunctivae are normal.  Neck: Normal range of motion. Neck supple. No tracheal deviation present.  Cardiovascular: Normal rate and regular rhythm.   Pulmonary/Chest: Effort normal and breath sounds normal. No stridor. She exhibits tenderness (Mild tenderness to mid chest on palpation without crepitus or emphysema.).  Abdominal: Soft.  Musculoskeletal: She exhibits no edema.  Neurological: She is alert and oriented to person, place, and time.  Skin: No rash noted.  Psychiatric: She has a normal mood  and affect.  Nursing note and vitals reviewed.    ED Treatments / Results  Labs (all labs ordered are listed, but only abnormal results are displayed) Labs Reviewed  BASIC METABOLIC PANEL - Abnormal; Notable for the following:       Result Value   Glucose, Bld 101 (*)    All other components within normal limits  CBC - Abnormal; Notable for the following:    MCV 77.8 (*)    MCH 25.5 (*)    All other components within normal limits  I-STAT TROPONIN, ED    EKG  EKG Interpretation None      Date:  03/23/2017  Rate: 79  Rhythm: normal sinus rhythm  QRS Axis: normal  Intervals: normal  ST/T Wave abnormalities: normal  Conduction Disutrbances: none  Narrative Interpretation:   Old EKG Reviewed: No significant changes noted     Radiology Dg Chest 2 View  Result Date: 03/23/2017 CLINICAL DATA:  Mid and right-sided chest pain for the past 3 days associated with shortness of breath. History of asthma. EXAM: CHEST  2 VIEW COMPARISON:  None in PACs FINDINGS: The lungs are adequately inflated. There is no focal infiltrate. The right infrahilar lung markings are coarse posteriorly. There is no pleural effusion. The heart and pulmonary vascularity are normal. The mediastinum is normal in width. The bony thorax is unremarkable. IMPRESSION: Probable subsegmental atelectasis in the right lower lobe. No definite pneumonia nor other acute cardiopulmonary abnormality. If the patient's symptoms persist, a repeat chest x-ray would be a useful next imaging step. Electronically Signed   By: David  [place] M.D.   On: 03/23/2017 09:49    Procedures Procedures (including critical care time)  Medications Ordered in ED Medications - No data to display   Initial Impression / Assessment and Plan / ED Course  I have reviewed the triage vital signs and the nursing notes.  Pertinent labs & imaging results that were available during my care of the patient were reviewed by me and considered in my medical decision making (see chart for details).     BP 118/70   Pulse 69   Temp 98.5 F (36.9 C) (Oral)   Resp 17   Ht  (1.6 m)   Wt 108.9 kg (240 lb)   LMP 03/18/2017 (Exact Date)   SpO2 99%   BMI 42.51 kg/m    Final Clinical Impressions(s) / ED Diagnoses   Final diagnoses:  Atypical chest pain    New Prescriptions Current Discharge Medication List     1:06 PM Pt here with reproducible chest wall pain, worse with position change.  Pain atypical for ACS.  HEART score of 1, low risk of  MACE.  PERC negative, doubt PE.  Reassurance given, pt stable for discharge.  Her CXR shows probably subsegmental atelectasis in RLL without definitive pneumonia.  Her sxs not suggestive of pna.  Recommend repeat CXR if sxs persists.  Currently low suspicion for biliary disease however I gave pt s/s to look for and to return.    Fayrene Helper, PA-C 03/23/17 1343    Cathren Laine, MD 03/24/17 [phone]

## 2017-07-08 ENCOUNTER — Emergency Department (HOSPITAL_COMMUNITY)
Admission: EM | Admit: 2017-07-08 | Discharge: 2017-07-08 | Disposition: A | Discharge status: Home / Self Care | Payer: 59 | Attending: Emergency Medicine | Admitting: Emergency Medicine

## 2017-07-08 ENCOUNTER — Other Ambulatory Visit: Payer: Self-pay

## 2017-07-08 ENCOUNTER — Encounter (HOSPITAL_COMMUNITY): Payer: Self-pay | Admitting: *Deleted

## 2017-07-08 DIAGNOSIS — J45909 Unspecified asthma, uncomplicated: Secondary | ICD-10-CM | POA: Diagnosis not present

## 2017-07-08 DIAGNOSIS — S61314A Laceration without foreign body of right ring finger with damage to nail, initial encounter: Secondary | ICD-10-CM | POA: Diagnosis not present

## 2017-07-08 DIAGNOSIS — S61304A Unspecified open wound of right ring finger with damage to nail, initial encounter: Secondary | ICD-10-CM | POA: Diagnosis not present

## 2017-07-08 DIAGNOSIS — Z79899 Other long term (current) drug therapy: Secondary | ICD-10-CM | POA: Diagnosis not present

## 2017-07-08 DIAGNOSIS — Y929 Unspecified place or not applicable: Secondary | ICD-10-CM | POA: Insufficient documentation

## 2017-07-08 DIAGNOSIS — Y999 Unspecified external cause status: Secondary | ICD-10-CM | POA: Diagnosis not present

## 2017-07-08 DIAGNOSIS — Y9389 Activity, other specified: Secondary | ICD-10-CM | POA: Insufficient documentation

## 2017-07-08 DIAGNOSIS — S61309A Unspecified open wound of unspecified finger with damage to nail, initial encounter: Secondary | ICD-10-CM

## 2017-07-08 DIAGNOSIS — Z880 Allergy status to penicillin: Secondary | ICD-10-CM | POA: Insufficient documentation
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
## 2017-07-08 NOTE — ED Notes (Signed)
Patient left at this time with all belongings. 

## 2017-07-08 NOTE — ED Notes (Signed)
Splint applied

## 2017-07-08 NOTE — ED Notes (Signed)
PA Rob at bedside 

## 2017-07-13 DIAGNOSIS — L03011 Cellulitis of right finger: Secondary | ICD-10-CM | POA: Diagnosis not present

## 2017-07-17 DIAGNOSIS — E559 Vitamin D deficiency, unspecified: Secondary | ICD-10-CM | POA: Diagnosis not present

## 2017-07-18 DIAGNOSIS — R1084 Generalized abdominal pain: Secondary | ICD-10-CM | POA: Diagnosis not present

## 2017-07-18 DIAGNOSIS — R5383 Other fatigue: Secondary | ICD-10-CM | POA: Diagnosis not present

## 2017-07-18 DIAGNOSIS — S61309D Unspecified open wound of unspecified finger with damage to nail, subsequent encounter: Secondary | ICD-10-CM | POA: Diagnosis not present

## 2017-07-25 DIAGNOSIS — S61309D Unspecified open wound of unspecified finger with damage to nail, subsequent encounter: Secondary | ICD-10-CM | POA: Diagnosis not present

## 2017-09-06 DIAGNOSIS — J069 Acute upper respiratory infection, unspecified: Secondary | ICD-10-CM | POA: Diagnosis not present

## 2017-10-09 DIAGNOSIS — N941 Unspecified dyspareunia: Secondary | ICD-10-CM | POA: Diagnosis not present

## 2017-10-09 DIAGNOSIS — R6882 Decreased libido: Secondary | ICD-10-CM | POA: Diagnosis not present

## 2017-10-09 DIAGNOSIS — K589 Irritable bowel syndrome without diarrhea: Secondary | ICD-10-CM | POA: Diagnosis not present

## 2017-10-09 DIAGNOSIS — N898 Other specified noninflammatory disorders of vagina: Secondary | ICD-10-CM | POA: Diagnosis not present

## 2017-12-20 ENCOUNTER — Other Ambulatory Visit: Payer: Self-pay | Admitting: Gastroenterology

## 2017-12-20 DIAGNOSIS — R112 Nausea with vomiting, unspecified: Secondary | ICD-10-CM | POA: Diagnosis not present

## 2017-12-20 DIAGNOSIS — R1030 Lower abdominal pain, unspecified: Secondary | ICD-10-CM | POA: Diagnosis not present

## 2017-12-20 DIAGNOSIS — N941 Unspecified dyspareunia: Secondary | ICD-10-CM | POA: Diagnosis not present

## 2017-12-20 DIAGNOSIS — N898 Other specified noninflammatory disorders of vagina: Secondary | ICD-10-CM | POA: Diagnosis not present

## 2017-12-20 DIAGNOSIS — Z3040 Encounter for surveillance of contraceptives, unspecified: Secondary | ICD-10-CM | POA: Diagnosis not present

## 2017-12-22 ENCOUNTER — Ambulatory Visit
Admission: RE | Admit: 2017-12-22 | Discharge: 2017-12-22 | Disposition: A | Discharge status: Home / Self Care | Payer: 59 | Source: Ambulatory Visit | Attending: Gastroenterology | Admitting: Gastroenterology

## 2017-12-22 DIAGNOSIS — R109 Unspecified abdominal pain: Secondary | ICD-10-CM | POA: Diagnosis not present

## 2017-12-22 DIAGNOSIS — R112 Nausea with vomiting, unspecified: Secondary | ICD-10-CM

## 2017-12-22 DIAGNOSIS — R1030 Lower abdominal pain, unspecified: Secondary | ICD-10-CM

## 2017-12-22 MED ORDER — IOPAMIDOL (ISOVUE-300) INJECTION 61%
100.0000 mL | Freq: Once | INTRAVENOUS | Status: AC | PRN
  Administered 2017-12-22: 100 mL via INTRAVENOUS

## 2018-01-23 DIAGNOSIS — K921 Melena: Secondary | ICD-10-CM | POA: Diagnosis not present

## 2018-01-23 DIAGNOSIS — R935 Abnormal findings on diagnostic imaging of other abdominal regions, including retroperitoneum: Secondary | ICD-10-CM | POA: Diagnosis not present

## 2018-01-23 DIAGNOSIS — K589 Irritable bowel syndrome without diarrhea: Secondary | ICD-10-CM | POA: Diagnosis not present

## 2018-01-25 DIAGNOSIS — K921 Melena: Secondary | ICD-10-CM | POA: Diagnosis not present

## 2018-01-25 DIAGNOSIS — R933 Abnormal findings on diagnostic imaging of other parts of digestive tract: Secondary | ICD-10-CM | POA: Diagnosis not present

## 2018-01-25 DIAGNOSIS — R1084 Generalized abdominal pain: Secondary | ICD-10-CM | POA: Diagnosis not present

## 2018-04-30 DIAGNOSIS — Z6841 Body Mass Index (BMI) 40.0 and over, adult: Secondary | ICD-10-CM | POA: Diagnosis not present

## 2018-04-30 DIAGNOSIS — Z01419 Encounter for gynecological examination (general) (routine) without abnormal findings: Secondary | ICD-10-CM | POA: Diagnosis not present

## 2018-04-30 DIAGNOSIS — Z124 Encounter for screening for malignant neoplasm of cervix: Secondary | ICD-10-CM | POA: Diagnosis not present

## 2018-04-30 DIAGNOSIS — R0789 Other chest pain: Secondary | ICD-10-CM | POA: Diagnosis not present

## 2018-04-30 DIAGNOSIS — J45909 Unspecified asthma, uncomplicated: Secondary | ICD-10-CM | POA: Diagnosis not present

## 2018-04-30 DIAGNOSIS — K219 Gastro-esophageal reflux disease without esophagitis: Secondary | ICD-10-CM | POA: Diagnosis not present

## 2018-04-30 LAB — HM PAP SMEAR: HM Pap smear: NEGATIVE

## 2018-05-15 DIAGNOSIS — S40861A Insect bite (nonvenomous) of right upper arm, initial encounter: Secondary | ICD-10-CM | POA: Diagnosis not present

## 2018-05-15 DIAGNOSIS — S40862A Insect bite (nonvenomous) of left upper arm, initial encounter: Secondary | ICD-10-CM | POA: Diagnosis not present

## 2018-05-15 DIAGNOSIS — S80862A Insect bite (nonvenomous), left lower leg, initial encounter: Secondary | ICD-10-CM | POA: Diagnosis not present

## 2018-05-31 DIAGNOSIS — L509 Urticaria, unspecified: Secondary | ICD-10-CM | POA: Diagnosis not present

## 2018-11-23 ENCOUNTER — Other Ambulatory Visit: Payer: Self-pay

## 2018-11-23 ENCOUNTER — Encounter: Payer: Self-pay | Admitting: Family Medicine

## 2018-11-23 ENCOUNTER — Ambulatory Visit (INDEPENDENT_AMBULATORY_CARE_PROVIDER_SITE_OTHER): Payer: 59 | Admitting: Family Medicine

## 2018-11-23 DIAGNOSIS — J4520 Mild intermittent asthma, uncomplicated: Secondary | ICD-10-CM

## 2018-11-23 DIAGNOSIS — K589 Irritable bowel syndrome without diarrhea: Secondary | ICD-10-CM

## 2018-11-23 DIAGNOSIS — R5383 Other fatigue: Secondary | ICD-10-CM | POA: Insufficient documentation

## 2018-11-23 MED ORDER — ALBUTEROL SULFATE HFA 108 (90 BASE) MCG/ACT IN AERS: 2.0000 | INHALATION_SPRAY | RESPIRATORY_TRACT | 2 refills | Status: DC | PRN

## 2018-11-23 NOTE — Progress Notes (Signed)
Virtual Visit via Video Note  I connected with Angelica Crosby   on 11/23/18 at  1:30 PM EDT by a video enabled telemedicine application and verified that I am speaking with the correct person using two identifiers.  Location patient: home Location provider:work office Persons participating in the virtual visit: patient, provider  I discussed the limitations of evaluation and management by telemedicine and the availability of in person appointments. The patient expressed understanding and agreed to proceed.   Angelica Crosby DOB: Sep 01, 1990 Encounter date: 11/23/2018  This is a 28 y.o. female who presents to establish care. No chief complaint on file.   History of present illness:  Her previous PCP Dr. Mardelle Matte at [place].  Mom worried about why she is always tired, asthma, sickle cell on dad's side. Just complains about body hurting, so mom worried about this - was tested a couple of years ago and was told that it was just sickle cell trait and not disease.   Asthma: when younger needed inhaler often. Now using just 1-2 times every 3 months. Albuterol inhlaer prn. Singulair prn. Does also take zyrtec for seasonal allergies.   Supposed to take omeprazole daily (Dr. Randa Evens) for irritable bowel syndrome and frequent vomiting.   Has been tired for a long time; more in last 2 years.   Sleeping ok - gets 6-8 hrs. Wakes up tired. No snoring that she knows of. Not rested in morning.   Mood is fine overall. No depression or anxiety. Has had episodes of anxiety in past - she is claustrophobic.   Occasional eczema. No joint pain. No joint swelling.   Last bloodwork 4 months ago. Was following w [facility] [place] [facility] as well.   Just stopped taking birth control; thought gaining weight with this. Periods monthly; last 3-5 days.   Diet overall - eats most meals at home. Works at [employer]. Checks things that come off the truck. Work is pretty active. Not getting regular exercise  except does walk 1-2 times/week.     Past Medical History:  Diagnosis Date  . Asthma   . Sickle cell trait [facility])    Past Surgical History:  Procedure Laterality Date  . HERNIA REPAIR     umbilical   Allergies  Allergen Reactions  . Amoxicillin Hives and Itching  . Penicillins Hives and Itching    Has patient had a PCN reaction causing immediate rash, facial/tongue/throat swelling, SOB or lightheadedness with hypotension: no Has patient had a PCN reaction causing severe rash involving mucus membranes or skin necrosis: No Has patient had a PCN reaction that required hospitalization: no  Has patient had a PCN reaction occurring within the last 10 years: yes If all of the above answers are "NO", then may proceed with Cephalosporin use.   No outpatient medications have been marked as taking for the 11/23/18 encounter (Office Visit) with Wynn Banker, MD.   Social History   Tobacco Use  . Smoking status: Never Smoker  . Smokeless tobacco: Never Used  Substance Use Topics  . Alcohol use: Yes    Alcohol/week: 0.0 standard drinks    Comment: occasional   Family History  Problem Relation Age of Onset  . Sleep apnea Mother   . High blood pressure Mother   . Asthma Mother   . Arthritis Mother   . Allergies Father   . Heart attack Maternal Grandmother   . Heart attack Maternal Grandfather   . Cancer Maternal Grandfather        ?  renal  . Healthy Paternal Grandmother   . Alzheimer's disease Paternal Grandfather   . ADD / ADHD Half-Brother   . ADD / ADHD Half-Brother   . Cerebral aneurysm Half-Brother   . Breast cancer Maternal Aunt      Review of Systems  Constitutional: Positive for fatigue. Negative for chills and fever.  Respiratory: Negative for cough, chest tightness, shortness of breath and wheezing.   Cardiovascular: Negative for chest pain, palpitations and leg swelling.    Objective:  There were no vitals taken for this visit.      BP Readings from  Last 3 Encounters:  07/08/17 138/90  03/23/17 (!) 123/56  02/02/16 108/74   Wt Readings from Last 3 Encounters:  07/08/17 250 lb (113.4 kg)  03/23/17 240 lb (108.9 kg)  02/02/16 268 lb 6.4 oz (121.7 kg)    EXAM:  GENERAL: alert, oriented, appears well and in no acute distress  HEENT: atraumatic, conjunctiva clear, no obvious abnormalities on inspection of external nose and ears  NECK: normal movements of the head and neck  LUNGS: on inspection no signs of respiratory distress, breathing rate appears normal, no obvious gross SOB, gasping or wheezing  CV: no obvious cyanosis  MS: moves all visible extremities without noticeable abnormality  PSYCH/NEURO: pleasant and cooperative, no obvious depression or anxiety, speech and thought processing grossly intact  SKIN: no facial abnormalities  Assessment/Plan  1. Fatigue, unspecified type Since she had blood work done in the last 4 months.  I encouraged her to try to get these records released to us.  I think it would be helpful to review these before ordering future blood work.  I do think some further evaluation for fatigue is warranted.  May need to see patient in office for exam.  2. Irritable bowel syndrome, unspecified type Follows with GI. Will work on record release so we can follow with plan.   3. Mild intermittent asthma: well controlled. inhlaer use prn.   If you haven't heard from me in 2 weeks after getting records to me please call and touch base. We will order further eval pending this review.   I discussed the assessment and treatment plan with the patient. The patient was provided an opportunity to ask questions and all were answered. The patient agreed with the plan and demonstrated an understanding of the instructions.   The patient was advised to call back or seek an in-person evaluation if the symptoms worsen or if the condition fails to improve as anticipated.  I provided 30 minutes of non-face-to-face time  during this encounter.   Theodis ShoveJunell , MD

## 2018-11-27 ENCOUNTER — Telehealth: Payer: Self-pay | Admitting: *Deleted

## 2018-11-27 NOTE — Telephone Encounter (Signed)
Message sent to the email as below.

## 2018-11-27 NOTE — Telephone Encounter (Signed)
-----   Message from Caren Macadam, MD sent at 11/23/2018  2:08 PM EDT ----- Can you please send her mychart info: [email or web address]  We will base follow up on this. I told her that record release was on our website; hopefully that is still true. She was trying to get [place] [facility] records

## 2018-12-07 ENCOUNTER — Telehealth: Payer: Self-pay | Admitting: *Deleted

## 2018-12-07 NOTE — Telephone Encounter (Signed)
Copied from [place] [id number]. Topic: General - Other >> Dec 07, 2018  3:23 PM Rainey Pines A wrote: Patient stated that she faxed over FMLA paperwork at 2:30 today and was also told by provider to call if she hasnt heard anything in a week in regards to medical records being transferred and would like a callback at [phone]

## 2018-12-14 NOTE — Telephone Encounter (Signed)
I called the pt and informed her we have not received FMLA paperwork as of today and advised she contact her employer.  Patient stated she has the paperwork and will try to have this faxed to [place].  Stated this is needed due to IBS and I advised it may be best to have the GI doctor complete this and she stated she has sent a copy to their office already.  Patient agreed to call back if needed.

## 2019-01-20 ENCOUNTER — Other Ambulatory Visit: Payer: Self-pay

## 2019-01-20 ENCOUNTER — Encounter (HOSPITAL_COMMUNITY): Payer: Self-pay

## 2019-01-20 ENCOUNTER — Ambulatory Visit (HOSPITAL_COMMUNITY)
Admission: EM | Admit: 2019-01-20 | Discharge: 2019-01-20 | Disposition: A | Discharge status: Home / Self Care | Payer: Managed Care, Other (non HMO) | Attending: Family Medicine | Admitting: Family Medicine

## 2019-01-20 DIAGNOSIS — T192XXA Foreign body in vulva and vagina, initial encounter: Secondary | ICD-10-CM

## 2019-01-20 NOTE — ED Triage Notes (Signed)
Pt presents with condom stuck in vagina.
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
## 2019-01-20 NOTE — Discharge Instructions (Signed)
Important to use backup method of contraception (condoms) when starting birth control pills for the first month. Return for repeat foreign body, vaginal pain, pelvic pain, vaginal discharge, missed period.

## 2019-01-29 ENCOUNTER — Encounter: Payer: Self-pay | Admitting: Family Medicine

## 2019-03-20 IMAGING — CR DG CHEST 2V
2 series · 2 of 2 positions shown · non-contrast
Comparison: None in PACs

CLINICAL DATA: Mid and right-sided chest pain for the past 3 days
associated with shortness of breath. History of asthma.

EXAM:
CHEST  2 VIEW

[chest pa]
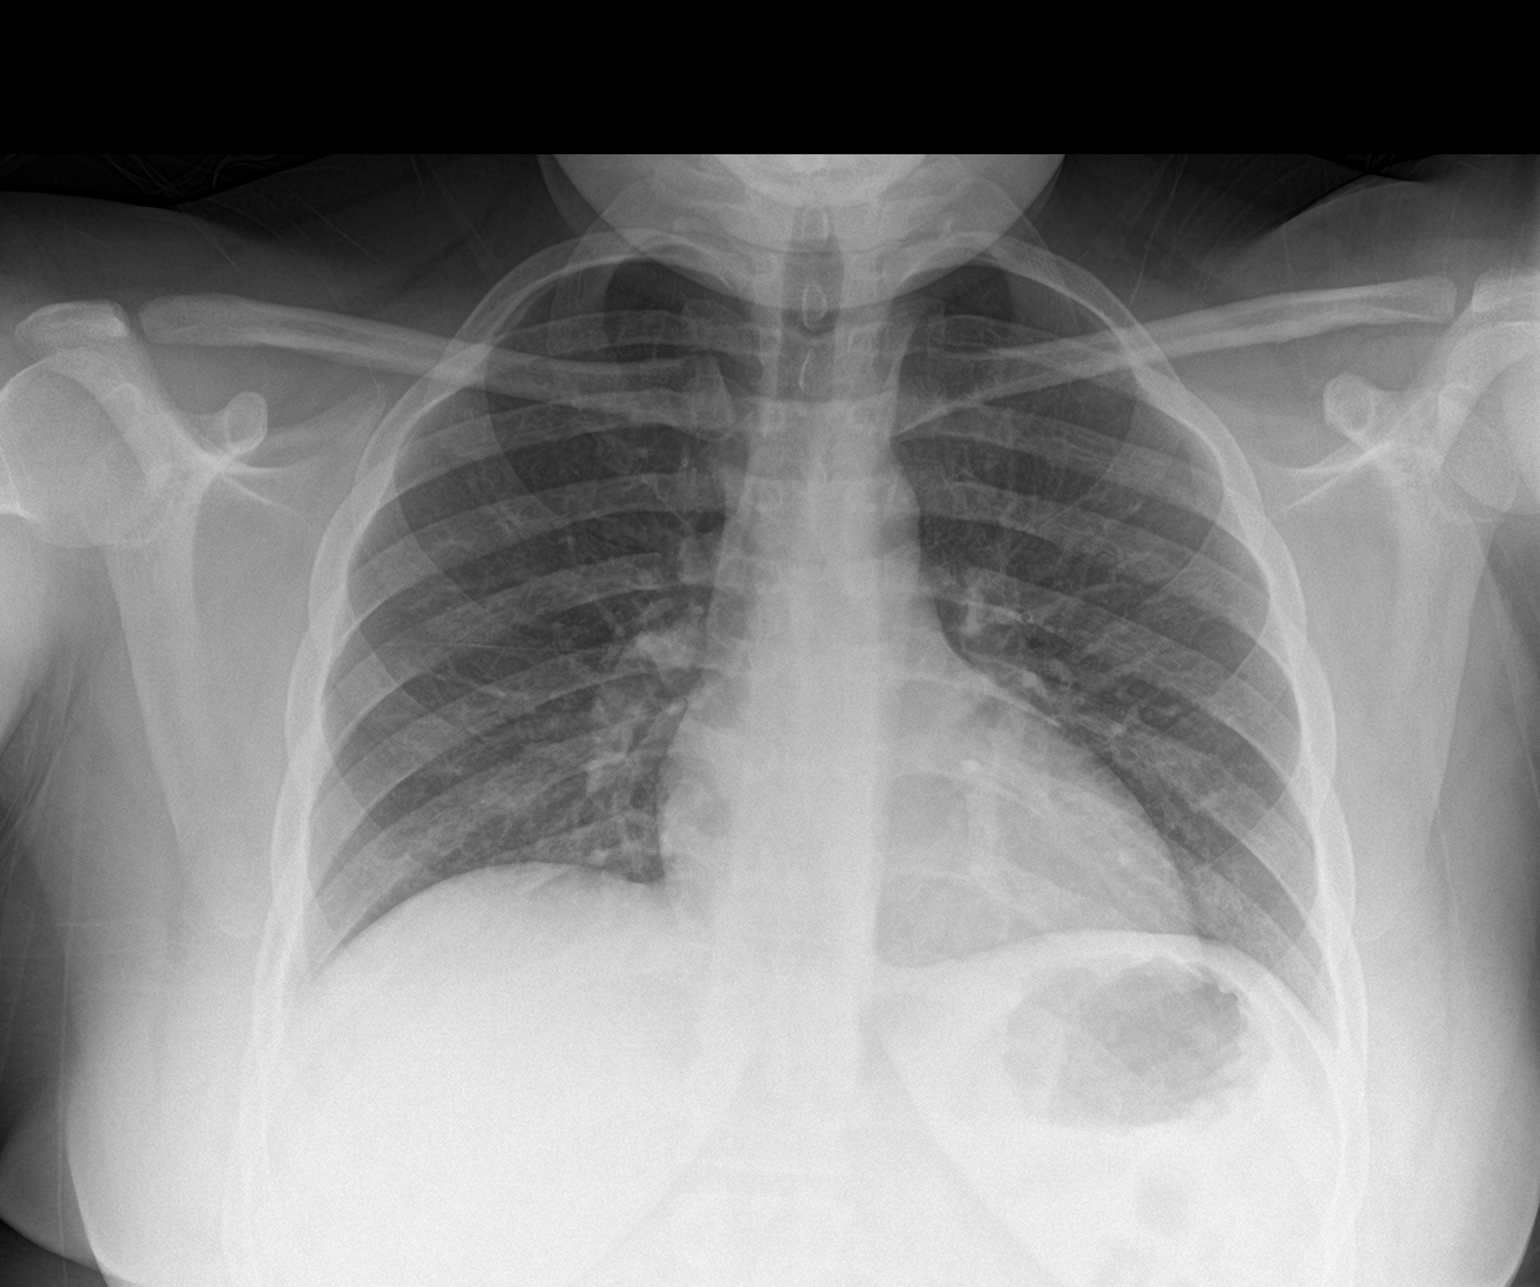

[chest lat]
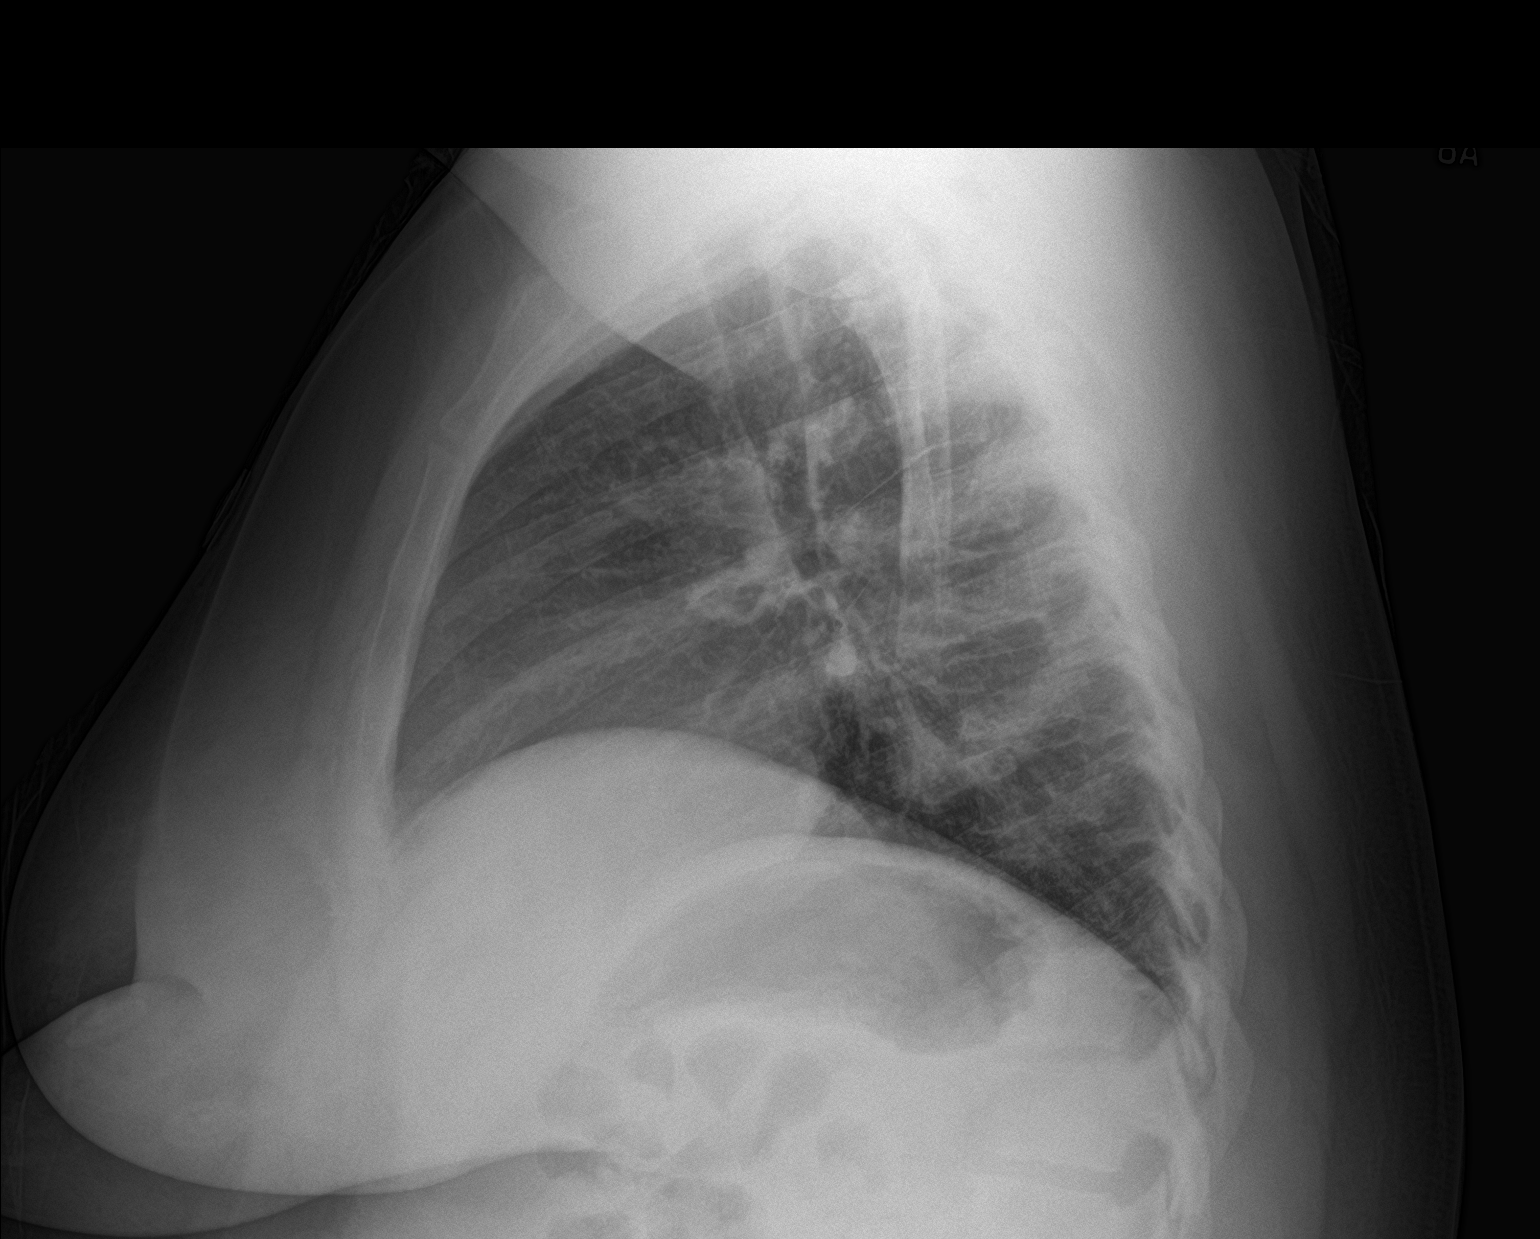

[2 of 2 positions shown; findings below may reference images not displayed]

FINDINGS: The lungs are adequately inflated. There is no focal infiltrate. The
right infrahilar lung markings are coarse posteriorly. There is no
pleural effusion. The heart and pulmonary vascularity are normal.
The mediastinum is normal in width. The bony thorax is unremarkable.
IMPRESSION: Probable subsegmental atelectasis in the right lower lobe. No
definite pneumonia nor other acute cardiopulmonary abnormality. If
the patient's symptoms persist, a repeat chest x-ray would be a
useful next imaging step.

## 2019-04-18 ENCOUNTER — Ambulatory Visit: Payer: Self-pay

## 2019-04-18 NOTE — Telephone Encounter (Signed)
Incoming call from Patient who complains of stuffy nose. Denies fever , cough.  Onset was Tuesday.   States breathing is "fine".  Reviewed protocol with Pt.   Provided recommendations for Pt.  To try for decongestion.  Marland Kitchen   Pt.  Voiced understanding.

## 2019-04-18 NOTE — Telephone Encounter (Signed)
   Reason for Disposition . Neti Pot, questions about  Answer Assessment - Initial Assessment Questions 1. ONSET: "When did the nasal discharge start?"      Tuesday 2. AMOUNT: "How much discharge is there?"      clear 3. COUGH: "Do you have a cough?" If yes, ask: "Describe the color of your sputum" (clear, white, yellow, green)   denies 4. RESPIRATORY DISTRESS: "Describe your breathing."     'fine" 5. FEVER: "Do you have a fever?" If so, ask: "What is your temperature, how was it measured, and when did it start?"  denies 6. SEVERITY: "Overall, how bad are you feeling right now?" (e.g., doesn't interfere with normal activities, staying home from school/work, staying in bed)     'not to bad' 7. OTHER SYMPTOMS: "Do you have any other symptoms?" (e.g., sore throat, earache, wheezing, vomiting)     denies 8. PREGNANCY: "Is there any chance you are pregnant?" "When was your last menstrual period?"    no  Protocols used: COMMON COLD-A-AH

## 2019-04-19 ENCOUNTER — Encounter: Payer: Self-pay | Admitting: Adult Health

## 2019-04-19 ENCOUNTER — Other Ambulatory Visit: Payer: Self-pay

## 2019-04-19 ENCOUNTER — Telehealth (INDEPENDENT_AMBULATORY_CARE_PROVIDER_SITE_OTHER): Payer: 59 | Admitting: Adult Health

## 2019-04-19 DIAGNOSIS — J069 Acute upper respiratory infection, unspecified: Secondary | ICD-10-CM

## 2019-04-19 NOTE — Progress Notes (Signed)
Virtual Visit via Video Note  I connected with Angelica Crosby on 04/19/19 at  3:30 PM EDT by a video enabled telemedicine application and verified that I am speaking with the correct person using two identifiers.  Location patient: home Location provider:work or home office Persons participating in the virtual visit: patient, provider  I discussed the limitations of evaluation and management by telemedicine and the availability of in person appointments. The patient expressed understanding and agreed to proceed.   HPI: 28 year old female who is being evaluated today for an acute issue.  Her symptoms started 4 days ago after she was sweeping up a lot of dust at work.  Symptoms include sinus pressure behind her eyes, head congestion, mild sore throat, and clear rhinorrhea.  She denies fevers, loss of taste or smell, nausea, vomiting, or diarrhea.  She has no shortness of breath or chest congestion.  Started using Flonase and Claritin yesterday.  No one at home is sick with the same symptoms and no one in her department at work has tested positive for Covid   ROS: See pertinent positives and negatives per HPI.  Past Medical History:  Diagnosis Date  . Asthma   . Sickle cell trait [facility])     Past Surgical History:  Procedure Laterality Date  . HERNIA REPAIR     umbilical    Family History  Problem Relation Age of Onset  . Sleep apnea Mother   . High blood pressure Mother   . Asthma Mother   . Arthritis Mother   . Allergies Father   . Heart attack Maternal Grandmother   . Heart attack Maternal Grandfather   . Cancer Maternal Grandfather        ?renal  . Healthy Paternal Grandmother   . Alzheimer's disease Paternal Grandfather   . ADD / ADHD Half-Brother   . ADD / ADHD Half-Brother   . Cerebral aneurysm Half-Brother   . Breast cancer Maternal Aunt      Current Outpatient Medications:  .  albuterol (VENTOLIN HFA) 108 (90 Base) MCG/ACT inhaler, Inhale 2 puffs into the lungs as  needed for wheezing or shortness of breath., Disp: 1 Inhaler, Rfl: 2 .  ibuprofen (ADVIL,MOTRIN) 600 MG tablet, Take 1 tablet (600 mg total) by mouth every 6 (six) hours as needed for moderate pain or cramping., Disp: 30 tablet, Rfl: 0 .  montelukast (SINGULAIR) 10 MG tablet, Take 1 tablet (10 mg total) by mouth at bedtime., Disp: 30 tablet, Rfl: 3  EXAM:  VITALS per patient if applicable:  GENERAL: alert, oriented, appears well and in no acute distress  HEENT: atraumatic, conjunttiva clear, no obvious abnormalities on inspection of external nose and ears  NECK: normal movements of the head and neck  LUNGS: on inspection no signs of respiratory distress, breathing rate appears normal, no obvious gross SOB, gasping or wheezing  CV: no obvious cyanosis  MS: moves all visible extremities without noticeable abnormality  PSYCH/NEURO: pleasant and cooperative, no obvious depression or anxiety, speech and thought processing grossly intact  ASSESSMENT AND PLAN:  Discussed the following assessment and plan:  1. Upper respiratory tract infection, unspecified type - Viral or allergic. Continue with Flonase and Claritin. Red flags reviewed. Follow up if symptoms do not prove over the next 2 to 3 days.  Or sooner if fevers develop.     I discussed the assessment and treatment plan with the patient. The patient was provided an opportunity to ask questions and all were answered. The patient agreed  with the plan and demonstrated an understanding of the instructions.   The patient was advised to call back or seek an in-person evaluation if the symptoms worsen or if the condition fails to improve as anticipated.   Dorothyann Peng, NP

## 2019-06-28 ENCOUNTER — Other Ambulatory Visit: Payer: Self-pay | Admitting: Obstetrics and Gynecology

## 2019-06-28 DIAGNOSIS — N926 Irregular menstruation, unspecified: Secondary | ICD-10-CM

## 2019-07-09 ENCOUNTER — Ambulatory Visit
Admission: RE | Admit: 2019-07-09 | Discharge: 2019-07-09 | Disposition: A | Discharge status: Home / Self Care | Payer: 59 | Source: Ambulatory Visit | Attending: Obstetrics and Gynecology | Admitting: Obstetrics and Gynecology

## 2019-07-09 DIAGNOSIS — N926 Irregular menstruation, unspecified: Secondary | ICD-10-CM

## 2019-07-10 ENCOUNTER — Other Ambulatory Visit: Payer: Self-pay | Admitting: Obstetrics & Gynecology

## 2019-07-10 DIAGNOSIS — N926 Irregular menstruation, unspecified: Secondary | ICD-10-CM

## 2019-07-16 ENCOUNTER — Other Ambulatory Visit: Payer: 59

## 2019-12-19 IMAGING — CT CT ABD-PELV W/ CM
1 of 2 series · 13 of 32 positions shown, 18 images · IV contrast (iopamidol)
Comparison: None.

CLINICAL DATA: Abdominal pain and vomiting

EXAM:
CT ABDOMEN AND PELVIS WITH CONTRAST
TECHNIQUE: Multidetector CT imaging of the abdomen and pelvis was performed
using the standard protocol following bolus administration of
intravenous contrast. Oral contrast was also administered.
CONTRAST:  100mL 2ET5LG-EMM IOPAMIDOL (2ET5LG-EMM) INJECTION 61%

[Series 2: abd/pelvis w/cm · axial · 0.98mm/px · z∈[-418,+12]mm · 13 of 98 slices shown, 18 images]
[im 6/98  soft-tissue]
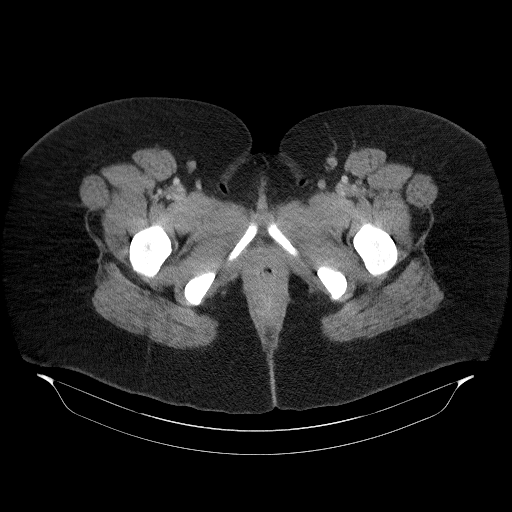
[im 6/98  bone]
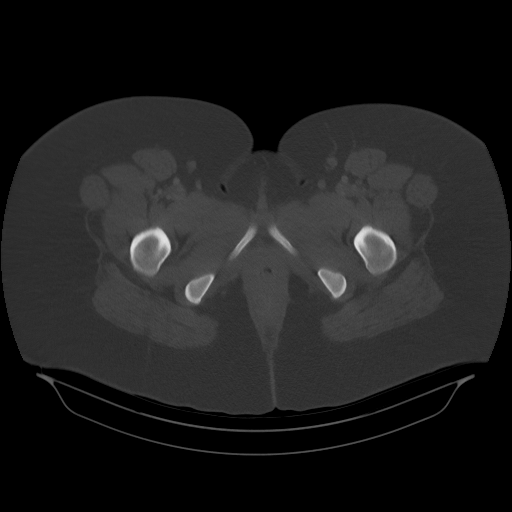
[im 18/98  soft-tissue]
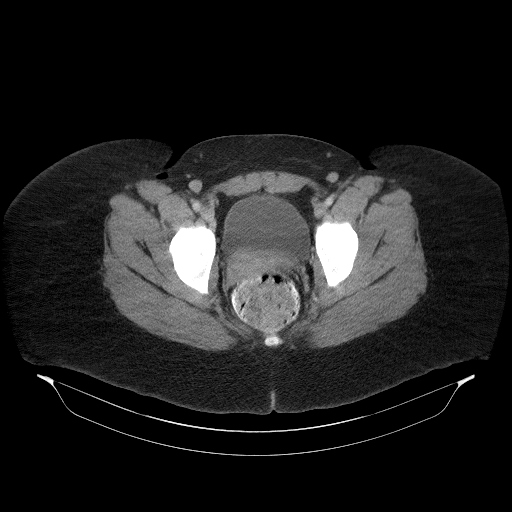
[im 23/98  soft-tissue]
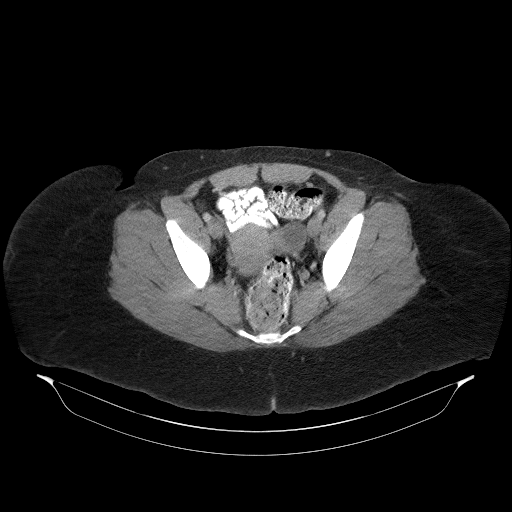
[im 29/98  soft-tissue]
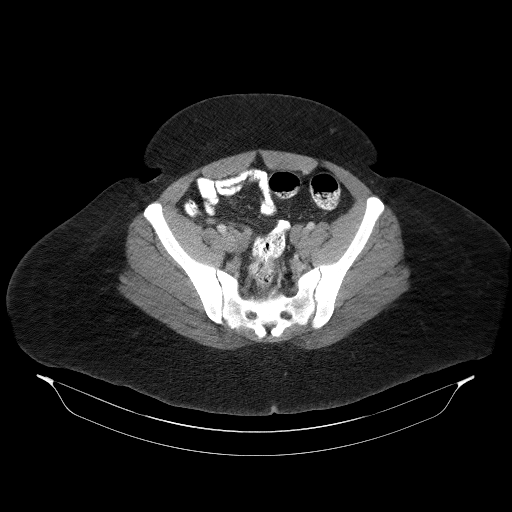
[im 40/98  soft-tissue]
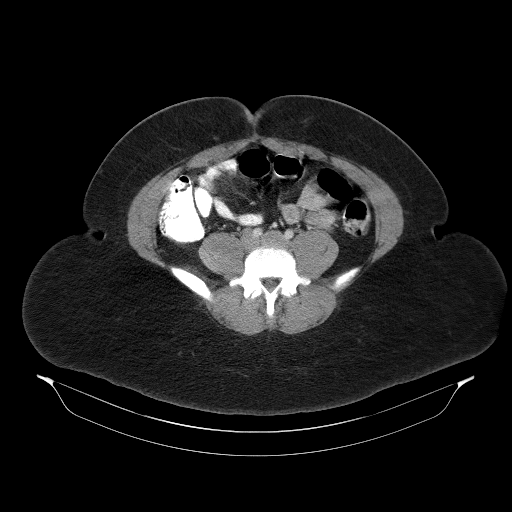
[im 46/98  soft-tissue]
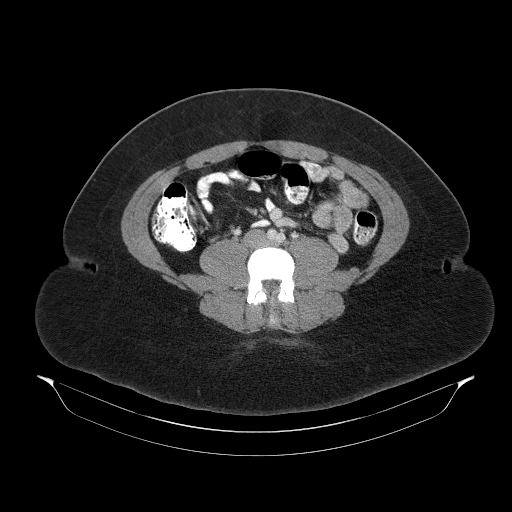
[im 52/98  soft-tissue]
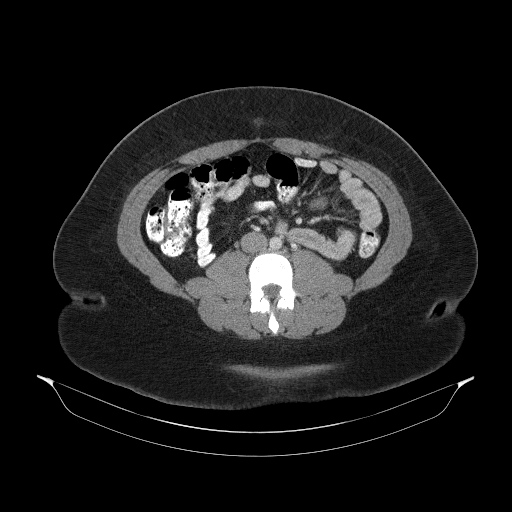
[im 63/98  soft-tissue]
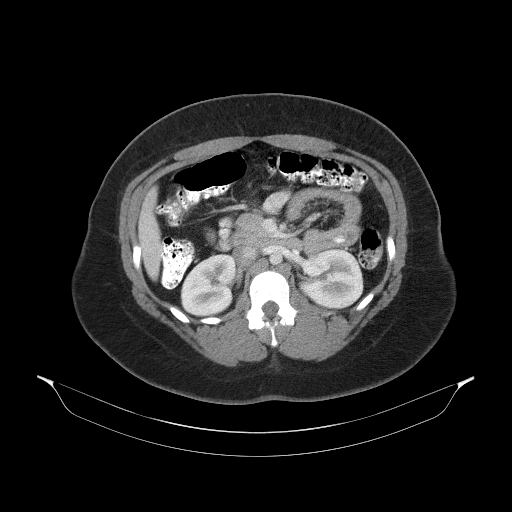
[im 69/98  soft-tissue]
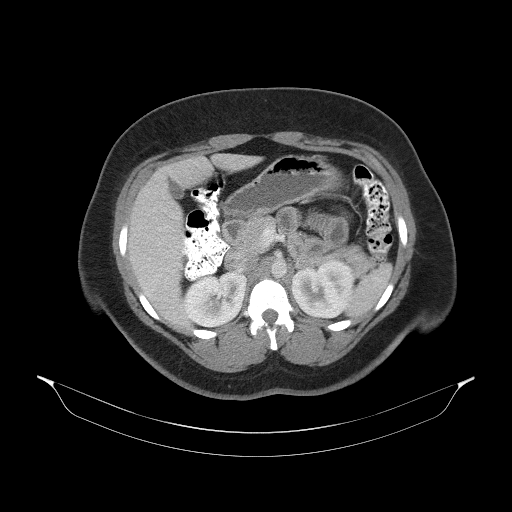
[im 69/98  bone]
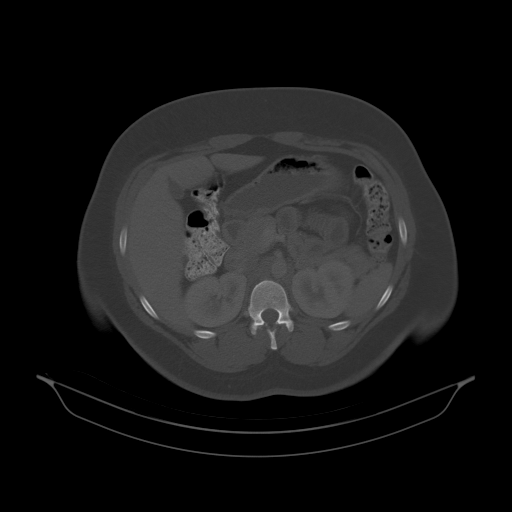
[im 75/98  soft-tissue]
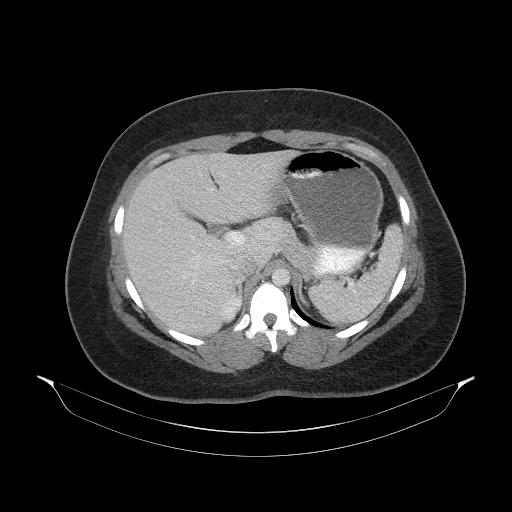
[im 75/98  lung]
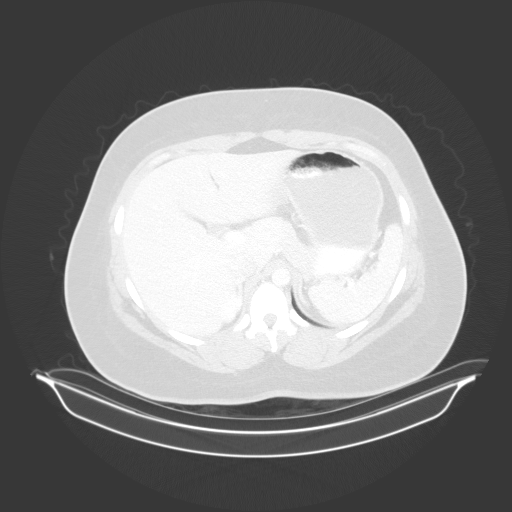
[im 80/98  lung]
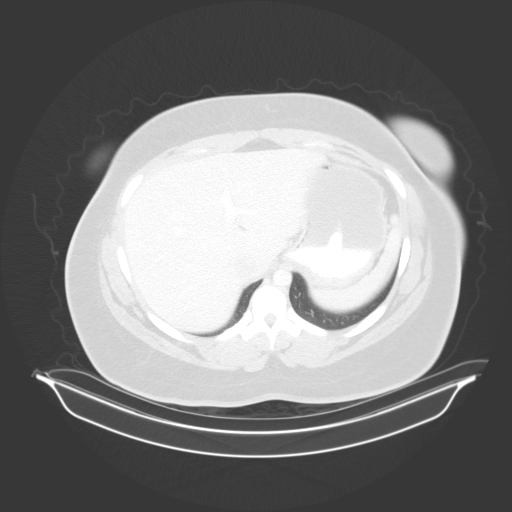
[im 86/98  soft-tissue]
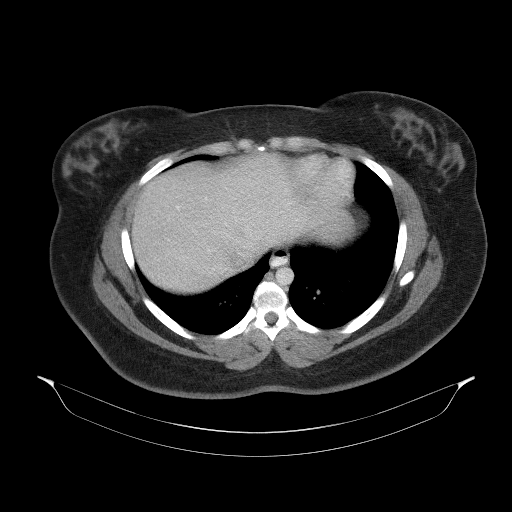
[im 86/98  lung]
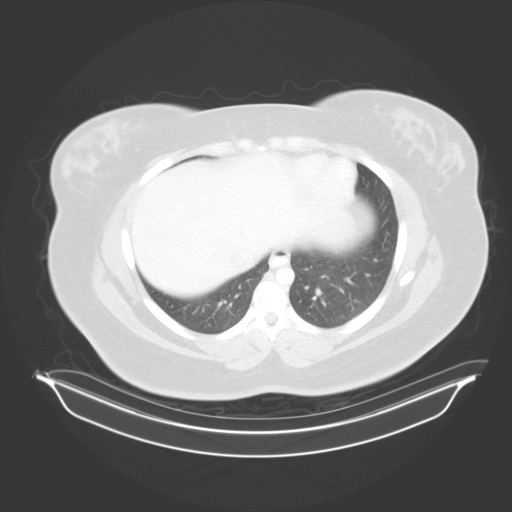
[im 92/98  soft-tissue]
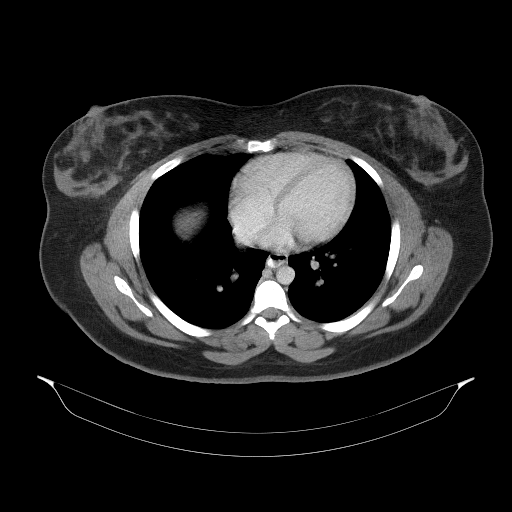
[im 92/98  lung]
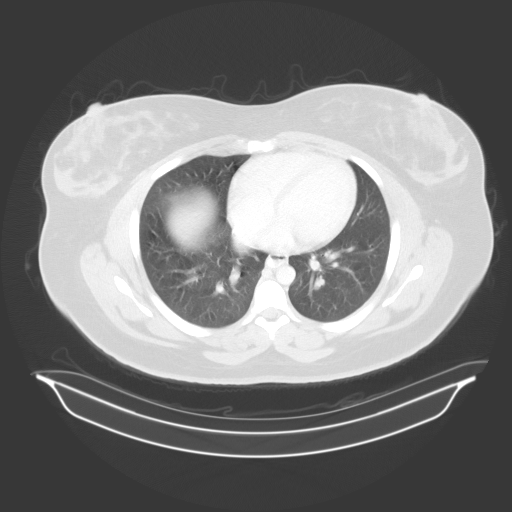

[13 of 32 positions shown; findings below may reference images not displayed]

FINDINGS: Lower chest: Lung bases are clear. There is oral contrast in the
distal esophagus.

Hepatobiliary: No focal liver lesions are apparent. Gallbladder wall
is not appreciably thickened. There is no biliary duct dilatation.

Pancreas: No pancreatic mass or inflammatory focus.

Spleen: No splenic lesions are evident.

Adrenals/Urinary Tract: Adrenals bilaterally appear normal. Kidneys
bilaterally show no evident mass or hydronephrosis on either side.
There is no renal or ureteral calculus on either side. Urinary
bladder is midline with wall thickness within normal limits.

Stomach/Bowel: There is slight wall thickening in several loops of
proximal jejunum. No other bowel wall thickening is evident. No
surrounding mesenteric thickening. No evident bowel obstruction. No
free air or portal venous air.

Vascular/Lymphatic: No abdominal aortic aneurysm. No vascular
lesions are evident. There is no adenopathy in the abdomen or
pelvis.

Reproductive: Uterus is anteverted. There is an apparent dominant
follicle in the left ovary measuring 2.6 x 2.6 cm. Beyond this
dominant follicle, there is no evident pelvic mass.

Other: Appendix appears normal. No abscess or ascites is evident in
the abdomen or pelvis.

Musculoskeletal: There is an apparent bone island in the rightward
aspect of the L2 vertebral body. No lytic or destructive bone
lesions are evident. There is no intramuscular or abdominal wall
lesion evident.
IMPRESSION: 1. Mild wall thickening involving several loops of proximal jejunum
consistent with enteritis. No bowel obstruction. Other bowel loops
appear normal. No abscess. Appendix appears normal.

2. No evident renal or ureteral calculus. No hydronephrosis on
either side.

3. Oral contrast in the distal esophagus may indicate a degree of
spontaneous gastroesophageal reflux.

## 2020-04-27 DIAGNOSIS — Z20822 Contact with and (suspected) exposure to covid-19: Secondary | ICD-10-CM | POA: Diagnosis not present

## 2020-04-27 DIAGNOSIS — U071 COVID-19: Secondary | ICD-10-CM | POA: Diagnosis not present

## 2020-05-05 DIAGNOSIS — Z20822 Contact with and (suspected) exposure to covid-19: Secondary | ICD-10-CM | POA: Diagnosis not present

## 2020-05-05 DIAGNOSIS — Z01411 Encounter for gynecological examination (general) (routine) with abnormal findings: Secondary | ICD-10-CM | POA: Diagnosis not present

## 2020-05-05 DIAGNOSIS — Z6841 Body Mass Index (BMI) 40.0 and over, adult: Secondary | ICD-10-CM | POA: Diagnosis not present

## 2020-05-05 DIAGNOSIS — Z113 Encounter for screening for infections with a predominantly sexual mode of transmission: Secondary | ICD-10-CM | POA: Diagnosis not present

## 2020-05-05 DIAGNOSIS — Z3040 Encounter for surveillance of contraceptives, unspecified: Secondary | ICD-10-CM | POA: Diagnosis not present

## 2020-05-05 DIAGNOSIS — Z03818 Encounter for observation for suspected exposure to other biological agents ruled out: Secondary | ICD-10-CM | POA: Diagnosis not present

## 2020-06-15 DIAGNOSIS — K219 Gastro-esophageal reflux disease without esophagitis: Secondary | ICD-10-CM | POA: Diagnosis not present

## 2020-06-18 DIAGNOSIS — U071 COVID-19: Secondary | ICD-10-CM | POA: Diagnosis not present

## 2020-06-18 DIAGNOSIS — R509 Fever, unspecified: Secondary | ICD-10-CM | POA: Diagnosis not present

## 2020-06-18 DIAGNOSIS — R069 Unspecified abnormalities of breathing: Secondary | ICD-10-CM | POA: Diagnosis not present

## 2020-06-18 DIAGNOSIS — R5383 Other fatigue: Secondary | ICD-10-CM | POA: Diagnosis not present

## 2020-06-18 DIAGNOSIS — Z20822 Contact with and (suspected) exposure to covid-19: Secondary | ICD-10-CM | POA: Diagnosis not present

## 2020-06-22 DIAGNOSIS — U071 COVID-19: Secondary | ICD-10-CM | POA: Diagnosis not present

## 2020-06-25 DIAGNOSIS — Z1152 Encounter for screening for COVID-19: Secondary | ICD-10-CM | POA: Diagnosis not present

## 2020-06-30 DIAGNOSIS — K5900 Constipation, unspecified: Secondary | ICD-10-CM | POA: Diagnosis not present

## 2020-06-30 DIAGNOSIS — K589 Irritable bowel syndrome without diarrhea: Secondary | ICD-10-CM | POA: Diagnosis not present

## 2020-07-01 DIAGNOSIS — Z862 Personal history of diseases of the blood and blood-forming organs and certain disorders involving the immune mechanism: Secondary | ICD-10-CM | POA: Diagnosis not present

## 2020-08-18 HISTORY — PX: OVARY SURGERY: SHX727

## 2020-11-12 DIAGNOSIS — N760 Acute vaginitis: Secondary | ICD-10-CM | POA: Diagnosis not present

## 2020-11-12 DIAGNOSIS — Z113 Encounter for screening for infections with a predominantly sexual mode of transmission: Secondary | ICD-10-CM | POA: Diagnosis not present

## 2020-11-12 DIAGNOSIS — N898 Other specified noninflammatory disorders of vagina: Secondary | ICD-10-CM | POA: Diagnosis not present

## 2020-11-12 DIAGNOSIS — Z3040 Encounter for surveillance of contraceptives, unspecified: Secondary | ICD-10-CM | POA: Diagnosis not present

## 2020-12-29 ENCOUNTER — Other Ambulatory Visit: Payer: Self-pay

## 2020-12-30 ENCOUNTER — Ambulatory Visit (INDEPENDENT_AMBULATORY_CARE_PROVIDER_SITE_OTHER): Payer: Self-pay | Admitting: Family Medicine

## 2020-12-30 ENCOUNTER — Encounter: Payer: Self-pay | Admitting: Family Medicine

## 2020-12-30 VITALS — BP 118/84 | HR 70 | Temp 98.4°F | Ht 63.0 in | Wt 265.4 lb

## 2020-12-30 DIAGNOSIS — D649 Anemia, unspecified: Secondary | ICD-10-CM

## 2020-12-30 DIAGNOSIS — R7989 Other specified abnormal findings of blood chemistry: Secondary | ICD-10-CM

## 2020-12-30 DIAGNOSIS — K589 Irritable bowel syndrome without diarrhea: Secondary | ICD-10-CM

## 2020-12-30 DIAGNOSIS — Z13220 Encounter for screening for lipoid disorders: Secondary | ICD-10-CM

## 2020-12-30 DIAGNOSIS — R252 Cramp and spasm: Secondary | ICD-10-CM

## 2020-12-30 DIAGNOSIS — Z832 Family history of diseases of the blood and blood-forming organs and certain disorders involving the immune mechanism: Secondary | ICD-10-CM

## 2020-12-30 DIAGNOSIS — G629 Polyneuropathy, unspecified: Secondary | ICD-10-CM

## 2020-12-30 DIAGNOSIS — Z131 Encounter for screening for diabetes mellitus: Secondary | ICD-10-CM

## 2020-12-30 LAB — URINALYSIS, ROUTINE W REFLEX MICROSCOPIC
Bilirubin Urine: NEGATIVE
Ketones, ur: NEGATIVE
Nitrite: NEGATIVE
Specific Gravity, Urine: 1.02 (ref 1.000–1.030)
Total Protein, Urine: NEGATIVE
Urine Glucose: NEGATIVE
Urobilinogen, UA: 0.2 (ref 0.0–1.0)
pH: 6.5 (ref 5.0–8.0)

## 2020-12-30 LAB — CBC WITH DIFFERENTIAL/PLATELET
Basophils Absolute: 0 10*3/uL (ref 0.0–0.1)
Basophils Relative: 0.3 % (ref 0.0–3.0)
Eosinophils Absolute: 0.1 10*3/uL (ref 0.0–0.7)
Eosinophils Relative: 1.6 % (ref 0.0–5.0)
HCT: 37.7 % (ref 36.0–46.0)
Hemoglobin: 12.9 g/dL (ref 12.0–15.0)
Lymphocytes Relative: 38.6 % (ref 12.0–46.0)
Lymphs Abs: 3.2 10*3/uL (ref 0.7–4.0)
MCHC: 34.2 g/dL (ref 30.0–36.0)
MCV: 81.5 fl (ref 78.0–100.0)
Monocytes Absolute: 0.7 10*3/uL (ref 0.1–1.0)
Monocytes Relative: 8.3 % (ref 3.0–12.0)
Neutro Abs: 4.2 10*3/uL (ref 1.4–7.7)
Neutrophils Relative %: 51.2 % (ref 43.0–77.0)
Platelets: 315 10*3/uL (ref 150.0–400.0)
RBC: 4.63 Mil/uL (ref 3.87–5.11)
RDW: 14.7 % (ref 11.5–15.5)
WBC: 8.2 10*3/uL (ref 4.0–10.5)

## 2020-12-30 LAB — LIPID PANEL
Cholesterol: 193 mg/dL (ref 0–200)
HDL: 63 mg/dL (ref >39.00)
LDL Cholesterol: 110 mg/dL — ABNORMAL HIGH (ref 0–99)
NonHDL: 130.16
Total CHOL/HDL Ratio: 3
Triglycerides: 103 mg/dL (ref 0.0–149.0)
VLDL: 20.6 mg/dL (ref 0.0–40.0)

## 2020-12-30 LAB — FOLATE: Folate: 9.9 ng/mL (ref >5.9)

## 2020-12-30 LAB — COMPREHENSIVE METABOLIC PANEL
ALT: 9 U/L (ref 0–35)
AST: 14 U/L (ref 0–37)
Albumin: 3.8 g/dL (ref 3.5–5.2)
Alkaline Phosphatase: 96 U/L (ref 39–117)
BUN: 11 mg/dL (ref 6–23)
CO2: 29 mEq/L (ref 19–32)
Calcium: 9.2 mg/dL (ref 8.4–10.5)
Chloride: 102 mEq/L (ref 96–112)
Creatinine, Ser: 0.96 mg/dL (ref 0.40–1.20)
GFR: 80.53 mL/min (ref >60.00)
Glucose, Bld: 87 mg/dL (ref 70–99)
Potassium: 4.7 mEq/L (ref 3.5–5.1)
Sodium: 137 mEq/L (ref 135–145)
Total Bilirubin: 0.3 mg/dL (ref 0.2–1.2)
Total Protein: 6.8 g/dL (ref 6.0–8.3)

## 2020-12-30 LAB — VITAMIN B12: Vitamin B-12: 424 pg/mL (ref 211–911)

## 2020-12-30 LAB — VITAMIN D 25 HYDROXY (VIT D DEFICIENCY, FRACTURES): VITD: 22.47 ng/mL — ABNORMAL LOW (ref 30.00–100.00)

## 2020-12-30 LAB — TSH: TSH: 7.17 u[IU]/mL — ABNORMAL HIGH (ref 0.35–5.50)

## 2020-12-30 NOTE — Addendum Note (Signed)
Addended by: Kandra Nicolas on: 12/30/2020 08:39 AM   Modules accepted: Orders

## 2020-12-30 NOTE — Addendum Note (Signed)
Addended by: Kandra Nicolas on: 12/30/2020 08:41 AM   Modules accepted: Orders

## 2020-12-30 NOTE — Addendum Note (Signed)
Addended by: Kristyana Notte X on: 12/30/2020 08:39 AM   Modules accepted: Orders  

## 2020-12-30 NOTE — Addendum Note (Signed)
Addended by: Dalayla Aldredge X on: 12/30/2020 08:41 AM   Modules accepted: Orders  

## 2020-12-30 NOTE — Addendum Note (Signed)
Addended by: Huxton Glaus X on: 12/30/2020 08:40 AM   Modules accepted: Orders  

## 2020-12-30 NOTE — Addendum Note (Signed)
Addended by: Kandra Nicolas on: 12/30/2020 08:40 AM   Modules accepted: Orders

## 2020-12-30 NOTE — Addendum Note (Signed)
Addended by: Kandra Nicolas on: 12/30/2020 08:42 AM   Modules accepted: Orders

## 2020-12-30 NOTE — Progress Notes (Signed)
Angelica Crosby DOB: 06-Jan-1991 Encounter date: 12/30/2020  This is a 30 y.o. female who presents with Chief Complaint  Patient presents with   Generalized Body Aches    Patient complains of severe body aches x4 months, states she is concerned due to sickle cell trait    History of present illness:  States that her mom told her to look into sickle cell trait more. She feels injection- needle sort of pain deeper into bones. In middle of night getting tensing/muscle spasm in feet. Getting needle sensation in arms, legs. Had sickle cell testing when younger and then again [place] - patient ordered. Not sure what triggered sx to worsen in last 4 months.   Has IBS: sometimes diarrhea, sometimes vomiting. Saw Dr. Randa Evens through [place]. Takes glycopyrolate when she is getting spasms of belly - just uses as needed. Seeing female provider at [facility] now that Dr. Randa Evens is no longer there. Needling pain wakes her at night.   Does have easy bruising; not worse from baseline.  No headaches. (Used to get migraines a lot more; take ibuprofen if she gets them which usually helps) No vision changes.  No joint swelling.   Currently works for [employer] full time. Sometimes hard energy wise to get through day.   Periods regulated with ocp. She has been on iron for deficiency in past (3-4 years ago). Iron levels went back up and so she stopped taking that. Was managed by primary. She hasn't seen specialist in past. Period is 3-4 days. First day heavy.   She is working on losing weight - she is down from 280; she is working on [employer]; increased water. Working with Systems analyst 3 days/week and has been doing this past 4 months. Takes womens probiotic, vitamin C, vitamin D.   Allergies  Allergen Reactions   Amoxicillin Hives and Itching   Penicillins Hives and Itching    Has patient had a PCN reaction causing immediate rash, facial/tongue/throat swelling, SOB or lightheadedness  with hypotension: no Has patient had a PCN reaction causing severe rash involving mucus membranes or skin necrosis: No Has patient had a PCN reaction that required hospitalization: no  Has patient had a PCN reaction occurring within the last 10 years: yes If all of the above answers are "NO", then may proceed with Cephalosporin use.   Current Meds  Medication Sig   albuterol (VENTOLIN HFA) 108 (90 Base) MCG/ACT inhaler Inhale 2 puffs into the lungs as needed for wheezing or shortness of breath.   Ethynodiol Diac-Eth Estradiol (KELNOR 1/35 PO) Kelnor 1/35 (28) 1 mg-35 mcg tablet   ibuprofen (ADVIL,MOTRIN) 600 MG tablet Take 1 tablet (600 mg total) by mouth every 6 (six) hours as needed for moderate pain or cramping.   montelukast (SINGULAIR) 10 MG tablet Take 1 tablet (10 mg total) by mouth at bedtime.    Review of Systems  Constitutional:  Negative for activity change, appetite change, chills, fatigue, fever and unexpected weight change.  HENT:  Negative for congestion, ear pain, hearing loss, sinus pressure, sinus pain, sore throat and trouble swallowing.   Eyes:  Negative for pain and visual disturbance.  Respiratory:  Negative for cough, chest tightness, shortness of breath and wheezing.   Cardiovascular:  Negative for chest pain, palpitations and leg swelling.  Gastrointestinal:  Positive for diarrhea and vomiting. Negative for abdominal pain, blood in stool, constipation and nausea.  Genitourinary:  Negative for difficulty urinating and menstrual problem.  Musculoskeletal:  Positive  for arthralgias (occasionally knee/ankle - lives on third floor; notes more when on feet) and myalgias. Negative for back pain and joint swelling.  Skin:  Negative for rash.  Neurological:  Positive for weakness (just with first waking up). Negative for dizziness, numbness and headaches.  Hematological:  Negative for adenopathy. Does not bruise/bleed easily.  Psychiatric/Behavioral:  Negative for sleep  disturbance and suicidal ideas. The patient is not nervous/anxious.    Objective:  BP 118/84 (BP Location: Left Arm, Patient Position: Sitting, Cuff Size: Large)   Pulse 70   Temp 98.4 F (36.9 C) (Oral)   Ht 5\' 3"  (1.6 m)   Wt 265 lb 6.4 oz (120.4 kg)   LMP 12/17/2020   BMI 47.01 kg/m   Weight: 265 lb 6.4 oz (120.4 kg)   BP Readings from Last 3 Encounters:  12/30/20 118/84  01/20/19 124/84  07/08/17 138/90   Wt Readings from Last 3 Encounters:  12/30/20 265 lb 6.4 oz (120.4 kg)  07/08/17 250 lb (113.4 kg)  03/23/17 240 lb (108.9 kg)    Physical Exam Constitutional:      General: She is not in acute distress.    Appearance: She is well-developed.  Cardiovascular:     Rate and Rhythm: Normal rate and regular rhythm.     Heart sounds: Normal heart sounds. No murmur heard.   No friction rub.  Pulmonary:     Effort: Pulmonary effort is normal. No respiratory distress.     Breath sounds: Normal breath sounds. No wheezing or rales.  Abdominal:     General: Abdomen is flat. Bowel sounds are normal. There is no distension.     Palpations: Abdomen is soft.     Tenderness: There is no abdominal tenderness.  Musculoskeletal:     Right lower leg: No edema.     Left lower leg: No edema.  Neurological:     Mental Status: She is alert and oriented to person, place, and time.  Psychiatric:        Behavior: Behavior normal.    Assessment/Plan  1. Irritable bowel syndrome, unspecified type Following with GI regularly.  2. Neuropathy Uncertain cause; not certain this would be coming from sickle cell trait; we are going to start with bloodwork and will consider further eval pending this.  3. Family history of sickle cell anemia (Hgb SS) in father I do not have bloodwork records for her; we are going to get labs today. - Sickle Cell Screen; Future - Urinalysis; Future  4. Muscle cramp See above. Continue with good hydration. - TSH; Future - Magnesium, RBC; Future -  VITAMIN D 25 Hydroxy (Vit-D Deficiency, Fractures); Future - Vitamin B12; Future  5. Lipid screening - Lipid panel; Future  6. Screening for diabetes mellitus - Comprehensive metabolic panel; Future  7. Anemia, unspecified type - CBC with Differential/Platelet; Future - Folate; Future - Iron, TIBC and Ferritin Panel; Future - Pathologist smear review - Urinalysis; Future   Return for pending bloodwork.      05/23/17, MD

## 2020-12-30 NOTE — Addendum Note (Signed)
Addended by: Tyreisha Ungar X on: 12/30/2020 08:41 AM   Modules accepted: Orders  

## 2021-01-01 ENCOUNTER — Other Ambulatory Visit (INDEPENDENT_AMBULATORY_CARE_PROVIDER_SITE_OTHER): Payer: Self-pay

## 2021-01-01 DIAGNOSIS — E039 Hypothyroidism, unspecified: Secondary | ICD-10-CM

## 2021-01-01 LAB — T4, FREE: Free T4: 0.98 ng/dL (ref 0.60–1.60)

## 2021-01-01 LAB — T3, FREE: T3, Free: 4.5 pg/mL — ABNORMAL HIGH (ref 2.3–4.2)

## 2021-01-02 LAB — MAGNESIUM, RBC: Magnesium RBC: 6.4 mg/dL (ref 4.0–6.4)

## 2021-01-02 LAB — SICKLE CELL SCREEN: Sickle Solubility Test - HGBRFX: POSITIVE — AB

## 2021-01-02 LAB — IRON,TIBC AND FERRITIN PANEL
%SAT: 12 % (calc) — ABNORMAL LOW (ref 16–45)
Ferritin: 46 ng/mL (ref 16–154)
Iron: 43 ug/dL (ref 40–190)
TIBC: 369 mcg/dL (calc) (ref 250–450)

## 2021-01-02 LAB — PATHOLOGIST SMEAR REVIEW

## 2021-01-05 ENCOUNTER — Other Ambulatory Visit: Payer: Self-pay | Admitting: Family Medicine

## 2021-01-08 MED ORDER — LEVOTHYROXINE SODIUM 50 MCG PO TABS: 50.0000 ug | ORAL_TABLET | Freq: Every day | ORAL | 1 refills | Status: DC

## 2021-01-08 NOTE — Addendum Note (Signed)
Addended by: Johnella Moloney on: 01/08/2021 08:40 AM   Modules accepted: Orders

## 2021-01-08 NOTE — Addendum Note (Signed)
Addended by: Johnella Moloney on: 01/08/2021 08:37 AM   Modules accepted: Orders

## 2021-01-15 ENCOUNTER — Encounter: Payer: Self-pay | Admitting: Family Medicine

## 2021-01-15 ENCOUNTER — Other Ambulatory Visit: Payer: Self-pay

## 2021-01-15 ENCOUNTER — Ambulatory Visit (INDEPENDENT_AMBULATORY_CARE_PROVIDER_SITE_OTHER): Payer: Federal, State, Local not specified - PPO | Admitting: Family Medicine

## 2021-01-15 VITALS — BP 126/70 | HR 81 | Resp 12 | Ht 63.0 in | Wt 265.0 lb

## 2021-01-15 DIAGNOSIS — M722 Plantar fascial fibromatosis: Secondary | ICD-10-CM | POA: Diagnosis not present

## 2021-01-15 MED ORDER — MELOXICAM 15 MG PO TABS: 15.0000 mg | ORAL_TABLET | Freq: Every day | ORAL | 0 refills | Status: AC

## 2021-01-15 NOTE — Progress Notes (Signed)
ACUTE VISIT Chief Complaint  Patient presents with   knot on bottom of right foot   HPI: Angelica Crosby is a 30 y.o. female with hx of asthma,IBS,and allergies here today complaining of a knot on the bottom of her right foot, sometimes painful. This is a new problem. No hx of trauma.  Foot Injury  The incident occurred 5 to 7 days ago. There was no injury mechanism. The pain is present in the right heel (bottom of right foot). The quality of the pain is described as stabbing and shooting. The pain is at a severity of 6/10. The pain is moderate. The pain has been Intermittent since onset. Pertinent negatives include no loss of motion, loss of sensation, muscle weakness or tingling. She reports no foreign bodies present. The symptoms are aggravated by weight bearing. She has tried nothing for the symptoms. The treatment provided no relief.  Worse when she fists gets up in the morning and beter after 15-20 min. Walking with no shoes also aggravate problem.  Alleviated by rest.  "Knot" disappear after massaging area for a few minutes.  She has not noted edema or erythema. No joint pain. Negative for fever, CP,SOB,palpitations, LE edema, tingling,or burning sensation.  Review of Systems  Constitutional:  Negative for appetite change and fatigue.  HENT:  Negative for mouth sores and sore throat.   Gastrointestinal:  Negative for nausea and vomiting.  Musculoskeletal:  Negative for arthralgias, back pain and myalgias.  Skin:  Negative for pallor and rash.  Neurological:  Negative for tingling, syncope and weakness.  Rest see pertinent positives and negatives per HPI.  Current Outpatient Medications on File Prior to Visit  Medication Sig Dispense Refill   albuterol (VENTOLIN HFA) 108 (90 Base) MCG/ACT inhaler Inhale 2 puffs into the lungs as needed for wheezing or shortness of breath. 1 Inhaler 2   ibuprofen (ADVIL,MOTRIN) 600 MG tablet Take 1 tablet (600 mg total) by mouth every  6 (six) hours as needed for moderate pain or cramping. 30 tablet 0   KELNOR 1/35 1-35 MG-MCG tablet Take 1 tablet by mouth daily.     levothyroxine (SYNTHROID) 50 MCG tablet Take 1 tablet (50 mcg total) by mouth daily before breakfast. 90 tablet 1   montelukast (SINGULAIR) 10 MG tablet Take 1 tablet (10 mg total) by mouth at bedtime. 30 tablet 3   No current facility-administered medications on file prior to visit.     Past Medical History:  Diagnosis Date   Asthma    Sickle cell trait (HCC)    Allergies  Allergen Reactions   Amoxicillin Hives and Itching   Penicillins Hives and Itching    Has patient had a PCN reaction causing immediate rash, facial/tongue/throat swelling, SOB or lightheadedness with hypotension: no Has patient had a PCN reaction causing severe rash involving mucus membranes or skin necrosis: No Has patient had a PCN reaction that required hospitalization: no  Has patient had a PCN reaction occurring within the last 10 years: yes If all of the above answers are "NO", then may proceed with Cephalosporin use.    Social History   Socioeconomic History   Marital status: Single    Spouse name: Not on file   Number of children: Not on file   Years of education: Not on file   Highest education level: Not on file  Occupational History   Not on file  Tobacco Use   Smoking status: Never   Smokeless tobacco: Never  Substance  and Sexual Activity   Alcohol use: Yes    Alcohol/week: 0.0 standard drinks    Comment: occasional   Drug use: No   Sexual activity: Yes    Birth control/protection: None  Other Topics Concern   Not on file  Social History Narrative   Not on file   Social Determinants of Health   Financial Resource Strain: Not on file  Food Insecurity: Not on file  Transportation Needs: Not on file  Physical Activity: Not on file  Stress: Not on file  Social Connections: Not on file   Vitals:   01/15/21 0949  BP: 126/70  Pulse: 81  Resp: 12   SpO2: 97%   Body mass index is 46.94 kg/m.  Physical Exam Vitals and nursing note reviewed.  Constitutional:      General: She is not in acute distress.    Appearance: She is well-developed. She is not ill-appearing.  HENT:     Head: Normocephalic and atraumatic.  Eyes:     Conjunctiva/sclera: Conjunctivae normal.  Cardiovascular:     Rate and Rhythm: Normal rate and regular rhythm.     Pulses:          Dorsalis pedis pulses are 2+ on the right side.       Posterior tibial pulses are 2+ on the right side.  Pulmonary:     Effort: Pulmonary effort is normal. No respiratory distress.  Musculoskeletal:     Right foot: Normal capillary refill. Tenderness present. No deformity or bony tenderness. Normal pulse.     Comments: Right foot:Tenderness upon palpation of heel mainly at the medial insertion of plantar fascia into calcaneous. There is no discomfort with palpation along planta fascia towards forefoot. no bunion  Dorsal flexion of first MTP does not elicits pain.  No edema,mass, or erythema appreciated on plantar area.  Skin:    General: Skin is warm.     Findings: No erythema or rash.  Neurological:     General: No focal deficit present.     Mental Status: She is alert and oriented to person, place, and time.  Psychiatric:     Comments: Well groomed, good eye contact.   ASSESSMENT AND PLAN:  Angelica Crosby was seen today for knot on bottom of right foot.  Diagnoses and all orders for this visit:  Plantar fasciitis -     meloxicam (MOBIC) 15 MG tablet; Take 1 tablet (15 mg total) by mouth daily for 10 days.  We discussed Dx,prognosis,and treatment options. I do not appreciate any plantar mass or local changes.  Stretching exercises, night splint,and antiinflammatories may help as well as wt loss. Mobic short course, 7-10 days. Conformable shoes, tennis ideally and/or shoe inserts. If not any better in 2-3 weeks, podiatrist evaluation may be necessary to discuss local  steroid injection.  She voices understanding and agrees with plan. Handout given.  Return if symptoms worsen or fail to improve.   Angelica Crosby G. [place], MD  [facility]. [place] office.

## 2021-01-15 NOTE — Patient Instructions (Addendum)
A few things to remember from today's visit:   Plantar fasciitis Mobic daily for 7-10 days. Over the counter Acetaminophen can be added. Night splint may help. Massage with a ball. If not better podiatric evaluation is going to be necessary.  Plantar Fasciitis  Plantar fasciitis is a painful foot condition that affects the heel. It occurs when the band of tissue that connects the toes to the heel bone (plantar fascia) becomes irritated. This can happen as the result of exercising too much or doing other repetitive activities (overuse injury). Plantar fasciitis can cause mild irritation to severe pain that makes it difficult to walk or move. The pain is usually worse in the morning after sleeping, or after sitting or lying down for a period of time. Pain may also beworse after long periods of walking or standing. What are the causes? This condition may be caused by: Standing for long periods of time. Wearing shoes that do not have good arch support. Doing activities that put stress on joints (high-impact activities). This includes ballet and exercise that makes your heart beat faster (aerobic exercise), such as running. Being overweight. An abnormal way of walking (gait). Tight muscles in the back of your lower leg (calf). High arches in your feet or flat feet. Starting a new athletic activity. What are the signs or symptoms? The main symptom of this condition is heel pain. Pain may get worse after the following: Taking the first steps after a time of rest, especially in the morning after awakening, or after you have been sitting or lying down for a while. Long periods of standing still. Pain may decrease after 30-45 minutes of activity, such as gentle walking. How is this diagnosed? This condition may be diagnosed based on your medical history, a physical exam, and your symptoms. Your health care provider will check for: A tender area on the bottom of your foot. A high arch in your  foot or flat feet. Pain when you move your foot. Difficulty moving your foot. You may have imaging tests to confirm the diagnosis, such as: X-rays. Ultrasound. MRI. How is this treated? Treatment for plantar fasciitis depends on how severe your condition is. Treatment may include: Rest, ice, pressure (compression), and raising (elevating) the affected foot. This is called RICE therapy. Your health care provider may recommend RICE therapy along with over-the-counter pain medicines to manage your pain. Exercises to stretch your calves and your plantar fascia. A splint that holds your foot in a stretched, upward position while you sleep (night splint). Physical therapy to relieve symptoms and prevent problems in the future. Injections of steroid medicine (cortisone) to relieve pain and inflammation. Stimulating your plantar fascia with electrical impulses (extracorporeal shock wave therapy). This is usually the last treatment option before surgery. Surgery, if other treatments have not worked after 12 months. Follow these instructions at home: Managing pain, stiffness, and swelling  If directed, put ice on the painful area. To do this: Put ice in a plastic bag, or use a frozen bottle of water. Place a towel between your skin and the bag or bottle. Roll the bottom of your foot over the bag or bottle. Do this for 20 minutes, 2-3 times a day. Wear athletic shoes that have air-sole or gel-sole cushions, or try soft shoe inserts that are designed for plantar fasciitis. Elevate your foot above the level of your heart while you are sitting or lying down.  Activity Avoid activities that cause pain. Ask your health care provider what  activities are safe for you. Do physical therapy exercises and stretches as told by your health care provider. Try activities and forms of exercise that are easier on your joints (low impact). Examples include swimming, water aerobics, and biking. General  instructions Take over-the-counter and prescription medicines only as told by your health care provider. Wear a night splint while sleeping, if told by your health care provider. Loosen the splint if your toes tingle, become numb, or turn cold and blue. Maintain a healthy weight, or work with your health care provider to lose weight as needed. Keep all follow-up visits. This is important. Contact a health care provider if you have: Symptoms that do not go away with home treatment. Pain that gets worse. Pain that affects your ability to move or do daily activities. Summary Plantar fasciitis is a painful foot condition that affects the heel. It occurs when the band of tissue that connects the toes to the heel bone (plantar fascia) becomes irritated. Heel pain is the main symptom of this condition. It may get worse after exercising too much or standing still for a long time. Treatment varies, but it usually starts with rest, ice, pressure (compression), and raising (elevating) the affected foot. This is called RICE therapy. Over-the-counter medicines can also be used to manage pain. This information is not intended to replace advice given to you by your health care provider. Make sure you discuss any questions you have with your healthcare provider. Document Revised: 09/23/2019 Document Reviewed: 09/23/2019 Elsevier Patient Education  2022 [employer].

## 2021-01-16 ENCOUNTER — Encounter: Payer: Self-pay | Admitting: Family Medicine

## 2021-03-15 ENCOUNTER — Other Ambulatory Visit: Payer: Self-pay

## 2021-05-31 ENCOUNTER — Telehealth: Payer: Self-pay | Admitting: Family Medicine

## 2021-05-31 NOTE — Telephone Encounter (Signed)
Patient calling in with respiratory symptoms: Shortness of breath, chest pain, palpitations or other red words send to Triage  Does the patient have a fever over 100, cough, congestion, sore throat, runny nose, lost of taste/smell (please list symptoms that patient has)? Stuffy nose and yellow drainage from nose x 7 days  What date did symptoms start? 05-24-2021 (If over 5 days ago, pt may be scheduled for in person visit)  Have you tested for Covid in the last 5 days? Yes   If yes, was it positive []  OR negative [] ? If positive in the last 5 days, please schedule virtual visit now. If negative, schedule for an in person OV with the next available provider if PCP has no openings. Please also let patient know they will be tested again (follow the script below)  "you will have to arrive prior to your appt time to be Covid tested. Please park in back of office at the [facility] & call [phone] to let the staff know you have arrived. A staff member will meet you at your car to do a rapid covid test. Once the test has resulted you will be notified by phone of your results to determine if appt will remain an in person visit or be converted to a virtual/phone visit. If you arrive less than before your appt time, your visit will be automatically converted to virtual & any recommended testing will happen AFTER the visit."  Pt has virtual with dr [phone] 06-01-2021  THINGS TO REMEMBER  If no availability for virtual visit in office,  please schedule another [place] office  If no availability at another  office, please instruct patient that they can schedule an evisit or virtual visit through their mychart account. Visits up to 8pm  patients can be seen in office 5 days after positive COVID test

## 2021-06-01 ENCOUNTER — Telehealth: Payer: Federal, State, Local not specified - PPO | Admitting: Family Medicine

## 2021-06-01 ENCOUNTER — Encounter: Payer: Self-pay | Admitting: Family Medicine

## 2021-06-01 DIAGNOSIS — R059 Cough, unspecified: Secondary | ICD-10-CM

## 2021-06-01 DIAGNOSIS — R0981 Nasal congestion: Secondary | ICD-10-CM

## 2021-06-01 MED ORDER — BENZONATATE 100 MG PO CAPS: ORAL_CAPSULE | ORAL | 0 refills | Status: DC

## 2021-06-01 MED ORDER — DOXYCYCLINE HYCLATE 100 MG PO TABS: 100.0000 mg | ORAL_TABLET | Freq: Two times a day (BID) | ORAL | 0 refills | Status: DC

## 2021-06-01 NOTE — Progress Notes (Signed)
Virtual Visit via Video Note  I connected with Keyanna  on 06/01/21 at  3:20 PM EST by a video enabled telemedicine application and verified that I am speaking with the correct person using two identifiers.  Location patient: home, [place] Location provider:work or home office Persons participating in the virtual visit: patient, provider  I discussed the limitations of evaluation and management by telemedicine and the availability of in person appointments. The patient expressed understanding and agreed to proceed.   HPI:  Acute telemedicine visit for sinus issues: -Onset: has baseline allergy issues but worse the last 1-2 weeks -Symptoms include: nasal congestion with thick yellow mucus, some sinus pain, cough -Denies:fevers, CP, SOB, NVD, body aches -Has tried: OTC medication, zyrtec -Pertinent past medical history:see below -Pertinent medication allergies:  Allergies  Allergen Reactions   Amoxicillin Hives and Itching   Other Other (See Comments)    Strawberries cause discoloration of tongue and pineapple juice cause a tingling sensation in the lips   Penicillins Hives and Itching    Has patient had a PCN reaction causing immediate rash, facial/tongue/throat swelling, SOB or lightheadedness with hypotension: no Has patient had a PCN reaction causing severe rash involving mucus membranes or skin necrosis: No Has patient had a PCN reaction that required hospitalization: no  Has patient had a PCN reaction occurring within the last 10 years: yes If all of the above answers are "NO", then may proceed with Cephalosporin use.  -COVID-19 vaccine status: Immunization History  Administered Date(s) Administered   PFIZER(Purple Top)SARS-COV-2 Vaccination 08/18/2020, 09/16/2020   PPD Test 02/02/2016   -denies any chance of pregnancy  ROS: See pertinent positives and negatives per HPI.  Past Medical History:  Diagnosis Date   Asthma    Sickle cell trait (HCC)     Past Surgical  History:  Procedure Laterality Date   HERNIA REPAIR     umbilical     Current Outpatient Medications:    albuterol (VENTOLIN HFA) 108 (90 Base) MCG/ACT inhaler, Inhale 2 puffs into the lungs as needed for wheezing or shortness of breath., Disp: 1 Inhaler, Rfl: 2   cetirizine (ZYRTEC) 10 MG tablet, Take 10 mg by mouth daily., Disp: , Rfl:    ibuprofen (ADVIL,MOTRIN) 600 MG tablet, Take 1 tablet (600 mg total) by mouth every 6 (six) hours as needed for moderate pain or cramping., Disp: 30 tablet, Rfl: 0   KELNOR 1/35 1-35 MG-MCG tablet, Take 1 tablet by mouth daily., Disp: , Rfl:    levothyroxine (SYNTHROID) 50 MCG tablet, Take 1 tablet (50 mcg total) by mouth daily before breakfast., Disp: 90 tablet, Rfl: 1  EXAM:  VITALS per patient if applicable:  GENERAL: alert, oriented, appears well and in no acute distress  HEENT: atraumatic, conjunttiva clear, no obvious abnormalities on inspection of external nose and ears  NECK: normal movements of the head and neck  LUNGS: on inspection no signs of respiratory distress, breathing rate appears normal, no obvious gross SOB, gasping or wheezing  CV: no obvious cyanosis  MS: moves all visible extremities without noticeable abnormality  PSYCH/NEURO: pleasant and cooperative, no obvious depression or anxiety, speech and thought processing grossly intact  ASSESSMENT AND PLAN:  Discussed the following assessment and plan:  Nasal congestion  Cough, unspecified type  -we discussed possible serious and likely etiologies, options for evaluation and workup, limitations of telemedicine visit vs in person visit, treatment, treatment risks and precautions. Pt is agreeable to treatment via telemedicine at this moment. Query uncontrolled allergic rhinitis,  developing bacterial sinusitis, new acute viral sinusitis vs other. She opted to try nasal saline, humidifier, adding on flonase with doxy course if worsening or not improving with the other  measures.  Advised to seek prompt in person care with PCP office or and ENT office if worsening, new symptoms arise, or if is not improving with treatment.   I discussed the assessment and treatment plan with the patient. The patient was provided an opportunity to ask questions and all were answered. The patient agreed with the plan and demonstrated an understanding of the instructions.     Terressa Koyanagi, DO

## 2021-06-01 NOTE — Patient Instructions (Addendum)
-   start flonase 2 sprays each nostril daily for 3 weeks, then one spray each nostril daily  -continue your zyrtec  -I sent the medication(s) we discussed to your pharmacy: Meds ordered this encounter  Medications   doxycycline (VIBRA-TABS) 100 MG tablet    Sig: Take 1 tablet (100 mg total) by mouth 2 (two) times daily.    Dispense:  20 tablet    Refill:  0     I hope you are feeling better soon!  Seek in person care promptly if your symptoms worsen, new concerns arise or you are not improving with treatment.  It was nice to meet you today. I help Dante out with telemedicine visits on Tuesdays and Thursdays and am happy to help if you need a virtual follow up visit on those days. Otherwise, if you have any concerns or questions following this visit please schedule a follow up visit with your Primary Care office or seek care at a local urgent care clinic to avoid delays in care

## 2021-06-23 DIAGNOSIS — Z113 Encounter for screening for infections with a predominantly sexual mode of transmission: Secondary | ICD-10-CM | POA: Diagnosis not present

## 2021-06-23 DIAGNOSIS — Z6841 Body Mass Index (BMI) 40.0 and over, adult: Secondary | ICD-10-CM | POA: Diagnosis not present

## 2021-06-23 DIAGNOSIS — Z01411 Encounter for gynecological examination (general) (routine) with abnormal findings: Secondary | ICD-10-CM | POA: Diagnosis not present

## 2021-06-23 DIAGNOSIS — F418 Other specified anxiety disorders: Secondary | ICD-10-CM | POA: Diagnosis not present

## 2021-06-23 DIAGNOSIS — Z124 Encounter for screening for malignant neoplasm of cervix: Secondary | ICD-10-CM | POA: Diagnosis not present

## 2021-06-23 LAB — HM PAP SMEAR: HM Pap smear: NEGATIVE

## 2021-07-08 DIAGNOSIS — N760 Acute vaginitis: Secondary | ICD-10-CM | POA: Diagnosis not present

## 2021-07-08 DIAGNOSIS — A5609 Other chlamydial infection of lower genitourinary tract: Secondary | ICD-10-CM | POA: Diagnosis not present

## 2021-07-08 DIAGNOSIS — F4381 Prolonged grief disorder: Secondary | ICD-10-CM | POA: Diagnosis not present

## 2021-07-23 ENCOUNTER — Ambulatory Visit: Payer: Federal, State, Local not specified - PPO | Admitting: Family Medicine

## 2021-07-23 ENCOUNTER — Encounter: Payer: Self-pay | Admitting: Family Medicine

## 2021-07-23 VITALS — BP 116/80 | HR 81 | Temp 98.4°F | Ht 63.0 in | Wt 271.4 lb

## 2021-07-23 DIAGNOSIS — R739 Hyperglycemia, unspecified: Secondary | ICD-10-CM

## 2021-07-23 DIAGNOSIS — Z91018 Allergy to other foods: Secondary | ICD-10-CM

## 2021-07-23 LAB — POCT GLYCOSYLATED HEMOGLOBIN (HGB A1C): Hemoglobin A1C: 5.6 % (ref 4.0–5.6)

## 2021-07-23 NOTE — Progress Notes (Signed)
Angelica Crosby DOB: Jan 29, 1991 Encounter date: 07/23/2021  This is a 31 y.o. female who presents with Chief Complaint  Patient presents with   Allergic Reaction    Patient requests a referral to an allergist as she has noticed itching of the tongue and pale layer on lips when eating strawberries and pineapple for the past 6 months    History of present illness: Prior to last 6 months; these things didn't bother her. No hx of other food allergies. Did have allergy testing as child; but it was more environmental then. She was allergic to a lot of environment - pet hair, dust.   Strawberry response is with fruit only. Pineapple flavor, juice, popcicles all seem to cause reaction. Lips feel really chapped; little bumps on lips; resolves with exfoliation. No other skin rash. Lasts a day or two but goes away with scrubbing.   Unsettled stomach with these foods. Has happened more than a handful of times.   Hasn't used albuterol lately.   Had psych eval - no depression; just more response to stressors. Would like to start therapy. Told that she doesn't have good "coping skills".  Would like to lose more weight. Wondering how to do this. Has tried cutting back on portions. Rarely eat sweets; only craves those when on period. Does go to gym; does cardio about 60 minutes (sometimes more) 3-4 days/week.weight has always been a struggle.   Had gyn exam last week - had chlamydia, BV. Finished treatment almost a week ago. Sx are better, but still some discharge/brown with wiping. Still odor.     Allergies  Allergen Reactions   Amoxicillin Hives and Itching   Other Other (See Comments)    Strawberries cause discoloration of tongue and pineapple juice cause a tingling sensation in the lips   Penicillins Hives and Itching    Has patient had a PCN reaction causing immediate rash, facial/tongue/throat swelling, SOB or lightheadedness with hypotension: no Has patient had a PCN reaction causing severe  rash involving mucus membranes or skin necrosis: No Has patient had a PCN reaction that required hospitalization: no  Has patient had a PCN reaction occurring within the last 10 years: yes If all of the above answers are "NO", then may proceed with Cephalosporin use.   Current Meds  Medication Sig   albuterol (VENTOLIN HFA) 108 (90 Base) MCG/ACT inhaler Inhale 2 puffs into the lungs as needed for wheezing or shortness of breath.   cetirizine (ZYRTEC) 10 MG tablet Take 10 mg by mouth daily.   ibuprofen (ADVIL,MOTRIN) 600 MG tablet Take 1 tablet (600 mg total) by mouth every 6 (six) hours as needed for moderate pain or cramping.   [facility] 1/35 1-35 MG-MCG tablet Take 1 tablet by mouth daily.    Review of Systems  Constitutional:  Negative for chills, fatigue and fever. Unexpected weight change: hard time with losing weight. Respiratory:  Negative for cough, chest tightness, shortness of breath and wheezing.   Cardiovascular:  Negative for chest pain, palpitations and leg swelling.   Objective:  BP 116/80 (BP Location: Left Arm, Patient Position: Sitting, Cuff Size: Large)    Pulse 81    Temp 98.4 F (36.9 C) (Oral)    Ht 5\' 3"  (1.6 m)    Wt 271 lb 6.4 oz (123.1 kg)    LMP 07/19/2021 (Exact Date)    SpO2 96%    BMI 48.08 kg/m   Weight: 271 lb 6.4 oz (123.1 kg)   BP Readings from Last  3 Encounters:  07/23/21 116/80  01/15/21 126/70  12/30/20 118/84   Wt Readings from Last 3 Encounters:  07/23/21 271 lb 6.4 oz (123.1 kg)  01/15/21 265 lb (120.2 kg)  12/30/20 265 lb 6.4 oz (120.4 kg)    Physical Exam Constitutional:      General: She is not in acute distress.    Appearance: She is well-developed. She is obese.  Cardiovascular:     Rate and Rhythm: Normal rate and regular rhythm.     Heart sounds: Normal heart sounds. No murmur heard.   No friction rub.  Pulmonary:     Effort: Pulmonary effort is normal. No respiratory distress.     Breath sounds: Normal breath sounds. No  wheezing or rales.  Musculoskeletal:     Right lower leg: No edema.     Left lower leg: No edema.  Neurological:     Mental Status: She is alert and oriented to person, place, and time.  Psychiatric:        Behavior: Behavior normal.    Assessment/Plan 1. Food allergy Will refer for further evaluation testing.  Based on symptoms and multiple encounters with trigger foods without having escalating allergy symptoms, I do not feel that she needs an EpiPen at this time. - Ambulatory referral to Allergy  2. Morbid obesity (HCC) She has not been through a formal weight loss program before.  I do think that a medication in the GLP-1 category may be helpful for her for weight loss, but I think it is reasonable for her to try to meet with weight loss specialist and work on more set diet and exercise plan for a few months first to see if she is able to have some success with this.  Weight has always been a struggle for her, so we did discuss weight loss medications (including Adipex, Contrave, Topamax, Wellbutrin, GLP-1).  Additionally, we discussed talking with food allergist.  She had evaluation for anxiety and depression and was told that she had poor coping skills.  She does tend to find herself being more of an emotional eater, so talking to this with therapist as well as weight loss specialist will be an important piece of the puzzle for her success.  Numbers given for [facility] today, but she was also told that she could do an online therapist. - Amb Ref to Medical Weight Management  3. Hyperglycemia - POC HgB A1c   Return in about 3 months (around 10/20/2021) for Chronic condition visit/weight recheck.    35 minutes spent in chart review, time with patient and discussion of follow-up treatment plan from allergy standpoint as well as weight loss standpoint, discussion of weight loss medications potential side effects and potential benefits, exam, charting.    Theodis Shove,  MD

## 2021-07-23 NOTE — Patient Instructions (Signed)
Lactose free yogurt: kefir.

## 2021-08-24 DIAGNOSIS — N898 Other specified noninflammatory disorders of vagina: Secondary | ICD-10-CM | POA: Diagnosis not present

## 2021-08-24 DIAGNOSIS — Z8619 Personal history of other infectious and parasitic diseases: Secondary | ICD-10-CM | POA: Diagnosis not present

## 2021-08-24 DIAGNOSIS — Z113 Encounter for screening for infections with a predominantly sexual mode of transmission: Secondary | ICD-10-CM | POA: Diagnosis not present

## 2021-08-24 DIAGNOSIS — N760 Acute vaginitis: Secondary | ICD-10-CM | POA: Diagnosis not present

## 2021-08-24 DIAGNOSIS — B3731 Acute candidiasis of vulva and vagina: Secondary | ICD-10-CM | POA: Diagnosis not present

## 2021-10-06 ENCOUNTER — Encounter: Payer: Self-pay | Admitting: Allergy

## 2021-10-06 ENCOUNTER — Ambulatory Visit: Payer: Federal, State, Local not specified - PPO | Admitting: Allergy

## 2021-10-06 VITALS — BP 124/70 | HR 78 | Temp 98.2°F | Resp 16 | Ht 62.0 in | Wt 264.4 lb

## 2021-10-06 DIAGNOSIS — H1013 Acute atopic conjunctivitis, bilateral: Secondary | ICD-10-CM

## 2021-10-06 DIAGNOSIS — J3089 Other allergic rhinitis: Secondary | ICD-10-CM | POA: Diagnosis not present

## 2021-10-06 DIAGNOSIS — T781XXD Other adverse food reactions, not elsewhere classified, subsequent encounter: Secondary | ICD-10-CM

## 2021-10-06 DIAGNOSIS — J4520 Mild intermittent asthma, uncomplicated: Secondary | ICD-10-CM | POA: Diagnosis not present

## 2021-10-06 DIAGNOSIS — L2089 Other atopic dermatitis: Secondary | ICD-10-CM

## 2021-10-06 DIAGNOSIS — T781XXA Other adverse food reactions, not elsewhere classified, initial encounter: Secondary | ICD-10-CM | POA: Diagnosis not present

## 2021-10-06 MED ORDER — RYALTRIS 665-25 MCG/ACT NA SUSP: NASAL | 2 refills | Status: DC

## 2021-10-06 NOTE — Patient Instructions (Signed)
-   Testing today showed: grasses, trees, outdoor molds, and dog. ?- Copy of test results provided.  ?- Avoidance measures provided. ?- Stop taking: Zyrtec ?- Start taking: Xyzal (levocetirizine) 5mg  tablet once daily ?Ryaltris (olopatadine/mometasone) two sprays per nostril 1-2 times daily as needed for nasal congestion or drainage control ?Pataday (olopatadine) one drop per eye daily as needed for itchy, watery eyes ?- You can use an extra dose of the antihistamine, if needed, for breakthrough symptoms.  ?- Consider nasal saline rinses 1-2 times daily to remove allergens from the nasal cavities as well as help with mucous clearance (this is especially helpful to do before the nasal sprays are given) ?- Consider allergy shots as a means of long-term control. ?- Allergy shots "re-train" and "reset" the immune system to ignore environmental allergens and decrease the resulting immune response to those allergens (sneezing, itchy watery eyes, runny nose, nasal congestion, etc).    ?- Allergy shots improve symptoms in 75-85% of patients.  ?- We can discuss more at a future appointment if the medications are not working for you. ? ?- Food testing for strawberry and pineapple is negative thus this confirms OAS ?- The oral allergy syndrome (OAS) or pollen-food allergy syndrome (PFAS) is a relatively common form of food allergy, particularly in adults. It typically occurs in people who have pollen allergies when the immune system "sees" proteins on the food that look like proteins on the pollen. This results in the allergy antibody (IgE) binding to the food instead of the pollen. Patients typically report itching and/or mild swelling of the mouth and throat immediately following ingestion of certain uncooked fruits (including nuts) or raw vegetables. Only a very small number of affected individuals experience systemic allergic reactions, such as anaphylaxis which occurs with true food allergies.   ? ? ? ?- Have access to  albuterol inhaler 2 puffs every 4-6 hours as needed for cough/wheeze/shortness of breath/chest tightness.  May use 15-20 minutes prior to activity.   Monitor frequency of use.   ? ?Asthma control goals:  ?Full participation in all desired activities (may need albuterol before activity) ?Albuterol use two time or less a week on average (not counting use with activity) ?Cough interfering with sleep two time or less a month ?Oral steroids no more than once a year ?No hospitalizations ? ?- Bathe and soak for 5-10 minutes in warm water once a day. Pat dry.  Immediately apply the below cream prescribed to flared areas (red, irritated, dry, itchy, patchy, scaly, flaky) only. Wait several minutes and then apply your moisturizer all over.   ? ?To affected areas on the body apply: ?Hydrocortisone 1% cream twice a day as needed. ? ? - Keep finger nails trimmed. ? ?Follow-up in 4-6 months or sooner if needed ? ?

## 2021-10-06 NOTE — Progress Notes (Signed)
? ? ?New Patient Note ? ?RE: Angelica Crosby MRN: [id number] DOB: 09/02/1990 ?Date of Office Visit: 10/06/2021 ? ?Primary care provider: Wynn Banker, MD ? ?Chief Complaint: Food and environmental allergy ? ?History of present illness: ?Angelica Crosby is a 31 y.o. female presenting today for evaluation of food allergy and allergic rhinitis. ? ?With strawberries and pineapples she notes lip and tongue tingling and as well as fuzzy tongue feeling and some skin itching.   She does not feel she has issues when eating these fruits cooked.  She has been noting this issue for about a year now.  She will still eat these foods in moderation.  ? ?She reports symptoms of itchy/watery eyes, runny/nose nose as well as sneezing that are year-round symptoms.  She takes zyrtec daily and sometimes helpful.  She has been using zyrtec for "forever".  She might have tried allergra when she was a child.  She has not used eye drops.  She has used flonase and occasionally perform saline rinses.   ? ?She has history of asthma as a child.  She does not note any specific triggers. May need to use albuterol about once a month if that.  She reports dry cough and shortness of breath and albuterol does help.  No hospitalizations.  Does not recall need for systemic steroids.  She denies needing a controller medications.  ? ?She has history of eczema that may flare about 2-3 times a year in the forearms and abdomen.  Will use HC OTC and moisturizations and also will use aloe vera.   ? ?Review of systems: ?Review of Systems  ?Constitutional: Negative.   ?HENT:    ?     See HPI  ?Eyes:   ?     See HPI  ?Respiratory: Negative.    ?Cardiovascular: Negative.   ?Gastrointestinal: Negative.   ?Musculoskeletal: Negative.   ?Skin: Negative.   ?Allergic/Immunologic: Negative.   ?Neurological: Negative.   ? ?All other systems negative unless noted above in HPI ? ?Past medical history: ?Past Medical History:  ?Diagnosis Date  ? Asthma   ? Eczema   ?  Sickle cell trait (HCC)   ? Urticaria   ? ? ?Past surgical history: ?Past Surgical History:  ?Procedure Laterality Date  ? HERNIA REPAIR    ? umbilical  ? OVARY SURGERY  08/2020  ? polyp removed  ? ? ?Family history:  ?Family History  ?Problem Relation Age of Onset  ? Sleep apnea Mother   ? High blood pressure Mother   ? Asthma Mother   ? Arthritis Mother   ? Allergies Father   ? Heart attack Maternal Grandmother   ? Heart attack Maternal Grandfather   ? Cancer Maternal Grandfather   ?     ?renal  ? Healthy Paternal Grandmother   ? Alzheimer's disease Paternal Grandfather   ? ADD / ADHD Half-Brother   ? ADD / ADHD Half-Brother   ? Cerebral aneurysm Half-Brother   ? Breast cancer Maternal Aunt   ? ? ?Social history: ?Lives in a house in an apartment.  Dwelling has carpeting with electric heating and central cooling.  No pets in the home.  There is no concern for water damage, mildew or roaches in the home.  She is a Doctor, hospital for [employer].  Denies a smoking history. ? ? ?Medication List: ?Current Outpatient Medications  ?Medication Sig Dispense Refill  ? albuterol (VENTOLIN HFA) 108 (90 Base) MCG/ACT inhaler Inhale 2 puffs into the lungs  as needed for wheezing or shortness of breath. 1 Inhaler 2  ? Ascorbic Acid (VITAMIN C) 1000 MG tablet 1 tablet    ? cetirizine (ZYRTEC) 10 MG tablet Take 10 mg by mouth daily.    ? cholecalciferol (VITAMIN D3) 25 MCG (1000 UNIT) tablet 1 tablet    ? diphenhydrAMINE (BENADRYL) 25 MG tablet 1 tablet at bedtime as needed    ? ibuprofen (ADVIL,MOTRIN) 600 MG tablet Take 1 tablet (600 mg total) by mouth every 6 (six) hours as needed for moderate pain or cramping. 30 tablet 0  ? [facility] MG[facility]MCG tablet Take 1 tablet by mouth daily.    ? Omeprazole 20 MG TBDD 1 capsule 30 minutes before morning meal    ? ?No current facility-administered medications for this visit.  ? ? ?Known medication allergies: ?Allergies  ?Allergen Reactions  ? Amoxicillin Hives and Itching  ? Other Other (See  Comments)  ?  Strawberries cause discoloration of tongue and pineapple juice cause a tingling sensation in the lips  ? Penicillins Hives and Itching  ?  Has patient had a PCN reaction causing immediate rash, facial/tongue/throat swelling, SOB or lightheadedness with hypotension: no ?Has patient had a PCN reaction causing severe rash involving mucus membranes or skin necrosis: No ?Has patient had a PCN reaction that required hospitalization: no ? ?Has patient had a PCN reaction occurring within the last 10 years: yes ?If all of the above answers are "NO", then may proceed with Cephalosporin use.  ? ? ? ?Physical examination: ?Blood pressure 124/70, pulse 78, temperature 98.2 ?F (36.8 ?C), temperature source Temporal, resp. rate 16, height 5\' 2"  (1.575 m), weight 264 lb 6.4 oz (119.9 kg), SpO2 97 %. ? ?General: Alert, interactive, in no acute distress. ?HEENT: PERRLA, TMs pearly gray, turbinates moderately edematous with clear discharge, post-pharynx non erythematous. ?Neck: Supple without lymphadenopathy. ?Lungs: Clear to auscultation without wheezing, rhonchi or rales. {no increased work of breathing. ?CV: Normal S1, S2 without murmurs. ?Abdomen: Nondistended, nontender. ?Skin: Warm and dry, without lesions or rashes. ?Extremities:  No clubbing, cyanosis or edema. ?Neuro:   Grossly intact. ? ?Diagnositics/Labs: ? ?Spirometry: FEV1: 1.98 L 76%, FVC: 2.46 L 81%, ratio consistent with essentially normal ? ?Allergy testing:  ? Airborne Adult Perc - 10/06/21 1001   ? ? Time Antigen Placed 1006   ? Allergen Manufacturer Waynette ButteryGreer   ? Location Back   ? Number of Test 59   ? 1. Control-Buffer 50% Glycerol Negative   ? 2. Control-Histamine 1 mg/ml 2+   ? 3. Albumin saline Negative   ? 4. Bahia 2+   ? 5. [place] Negative   ? 6. Johnson Negative   ? 7. [place] Blue 4+   ? 8. Meadow Fescue 3+   ? 9. Perennial Rye 3+   ? 10. Sweet Vernal 2+   ? 11. Timothy Negative   ? 12. Cocklebur Negative   ? 13. Burweed Marshelder Negative   ?  14. Ragweed, short Negative   ? 15. Ragweed, Giant Negative   ? 16. Plantain,  English Negative   ? 17. Lamb's Quarters Negative   ? 18. Sheep Sorrell Negative   ? 19. Rough Pigweed Negative   ? 20. Marsh Elder, Rough Negative   ? 21. Mugwort, Common Negative   ? 22. Ash mix Negative   ? 23. Charletta CousinBirch mix Negative   ? 24. Beech American Negative   ? 25. Box, Elder Negative   ? 26. Cedar, red Negative   ?  27. Cottonwood, Eastern Negative   ? 28. Elm mix Negative   ? 29. Hickory 3+   ? 30. Maple mix Negative   ? 31. Oak, [place] mix 2+   ? 32. Pecan Pollen 4+   ? 33. Pine mix Negative   ? 34. Sycamore Eastern Negative   ? 35. Walnut, Black Pollen Negative   ? 36. Alternaria alternata 2+   ? 37. Cladosporium Herbarum Negative   ? 38. Aspergillus mix Negative   ? 39. Penicillium mix Negative   ? 40. Bipolaris sorokiniana (Helminthosporium) Negative   ? 41. Drechslera spicifera (Curvularia) Negative   ? 42. Mucor plumbeus Negative   ? 43. Fusarium moniliforme Negative   ? 44. Aureobasidium pullulans (pullulara) Negative   ? 45. Rhizopus oryzae Negative   ? 46. Botrytis cinera Negative   ? 47. Epicoccum nigrum Negative   ? 48. Phoma betae Negative   ? 49. Candida Albicans Negative   ? 50. Trichophyton mentagrophytes Negative   ? 51. Mite, D Farinae  5,000 AU/ml Negative   ? 52. Mite, D Pteronyssinus  5,000 AU/ml Negative   ? 53. Cat Hair 10,000 BAU/ml Negative   ? 54.  Dog Epithelia 2+   ? 55. Mixed Feathers Negative   ? 56. Horse Epithelia Negative   ? 57. Cockroach, [place] Negative   ? 58. Mouse Negative   ? 59. Tobacco Leaf Negative   ? ?  ?  ? ?  ? ? Food Adult Perc - 10/06/21 1000   ? ? Time Antigen Placed 1006   ? Allergen Manufacturer Waynette Buttery   ? Location Back   ? Number of allergen test 2   ? 60. Strawberry Negative   ? 63. Pineapple Negative   ? ?  ?  ? ?  ?  ?Allergy testing results were read and interpreted by provider, documented by clinical staff. ? ? ?Assessment and plan: ?Allergic rhinitis with conjunctivitis ?-  Testing today showed: grasses, trees, outdoor molds, and dog. ?- Copy of test results provided.  ?- Avoidance measures provided. ?- Stop taking: Zyrtec ?- Start taking: Xyzal (levocetirizine) 5mg  tablet

## 2022-03-09 ENCOUNTER — Ambulatory Visit: Payer: Federal, State, Local not specified - PPO | Admitting: Allergy

## 2022-03-29 ENCOUNTER — Ambulatory Visit: Payer: Federal, State, Local not specified - PPO | Admitting: Family Medicine

## 2022-03-29 ENCOUNTER — Encounter: Payer: Self-pay | Admitting: Family Medicine

## 2022-03-29 VITALS — BP 112/62 | HR 82 | Temp 98.9°F | Ht 62.0 in | Wt 263.2 lb

## 2022-03-29 DIAGNOSIS — D573 Sickle-cell trait: Secondary | ICD-10-CM | POA: Diagnosis not present

## 2022-03-29 DIAGNOSIS — E559 Vitamin D deficiency, unspecified: Secondary | ICD-10-CM

## 2022-03-29 DIAGNOSIS — R739 Hyperglycemia, unspecified: Secondary | ICD-10-CM | POA: Diagnosis not present

## 2022-03-29 DIAGNOSIS — F902 Attention-deficit hyperactivity disorder, combined type: Secondary | ICD-10-CM | POA: Diagnosis not present

## 2022-03-29 DIAGNOSIS — Z13220 Encounter for screening for lipoid disorders: Secondary | ICD-10-CM | POA: Diagnosis not present

## 2022-03-29 DIAGNOSIS — R7989 Other specified abnormal findings of blood chemistry: Secondary | ICD-10-CM

## 2022-03-29 NOTE — Progress Notes (Unsigned)
Established Patient Office Visit  Subjective   Patient ID: Angelica Crosby, female    DOB: 09/29/1990  Age: 31 y.o. MRN: [id number]  Chief Complaint  Patient presents with   Establish Care    Patient is here for transition of care visit. States that she saw the allergist and was told she was allergic to pollenated fruits.  Patient reports that she thinks she might have a history of ADHD, states that she is having more trouble with focusing and attention, states that it was present as a child also. Would like a referral to [facility]   Current Outpatient Medications  Medication Instructions   albuterol (VENTOLIN HFA) 108 (90 Base) MCG/ACT inhaler 2 puffs, Inhalation, As needed   Ascorbic Acid (VITAMIN C) 1000 MG tablet 1 tablet   cetirizine (ZYRTEC) 10 mg, Oral, Daily   cholecalciferol (VITAMIN D3) 25 MCG (1000 UNIT) tablet 1 tablet   diphenhydrAMINE (BENADRYL) 25 MG tablet 1 tablet at bedtime as needed   ibuprofen (ADVIL) 600 mg, Oral, Every 6 hours PRN   [facility] 1/35 1-35 MG-MCG tablet 1 tablet, Oral, Daily   Olopatadine-Mometasone (RYALTRIS) 665-25 MCG/ACT SUSP Place 2 sprays per nostril 1-2 times daily as needed for nasal congestion or drainage contro   Omeprazole 20 MG TBDD 1 capsule 30 minutes before morning meal    Patient Active Problem List   Diagnosis Date Noted   Vitamin D deficiency 03/29/2022   Sickle cell trait (HCC)    Fatigue 11/23/2018   Irritable bowel 11/23/2018   Allergic rhinitis 11/19/2014   Mild intermittent asthma 11/19/2014      Review of Systems  All other systems reviewed and are negative.     Objective:     BP 112/62 (BP Location: Left Arm, Patient Position: Sitting, Cuff Size: Large)   Pulse 82   Temp 98.9 F (37.2 C) (Oral)   Ht 5\' 2"  (1.575 m)   Wt 263 lb 3.2 oz (119.4 kg)   LMP 03/26/2022 (Exact Date)   SpO2 98%   BMI 48.14 kg/m  {Vitals History (Optional):23777}  Physical Exam Vitals reviewed.  Constitutional:      Appearance:  Normal appearance. She is well-groomed and normal weight.  HENT:     Head: Normocephalic and atraumatic.     Mouth/Throat:     Mouth: Mucous membranes are moist.     Pharynx: Oropharynx is clear.  Eyes:     Extraocular Movements: Extraocular movements intact.     Conjunctiva/sclera: Conjunctivae normal.     Pupils: Pupils are equal, round, and reactive to light.  Cardiovascular:     Rate and Rhythm: Normal rate and regular rhythm.     Pulses: Normal pulses.     Heart sounds: S1 normal and S2 normal.  Pulmonary:     Effort: Pulmonary effort is normal.     Breath sounds: Normal breath sounds and air entry.  Abdominal:     General: Abdomen is flat. Bowel sounds are normal.     Palpations: Abdomen is soft.  Musculoskeletal:        General: Normal range of motion.     Cervical back: Normal range of motion and neck supple.     Right lower leg: No edema.     Left lower leg: No edema.  Skin:    General: Skin is warm and dry.  Neurological:     Mental Status: She is alert and oriented to person, place, and time. Mental status is at baseline.  Gait: Gait is intact.  Psychiatric:        Mood and Affect: Mood and affect normal.        Speech: Speech normal.        Behavior: Behavior normal.        Judgment: Judgment normal.      No results found for any visits on 03/29/22.  Last thyroid functions Lab Results  Component Value Date   TSH 7.17 (H) 12/30/2020   Last vitamin D Lab Results  Component Value Date   VD25OH 22.47 (L) 12/30/2020      The ASCVD Risk score (Arnett DK, et al., 2019) failed to calculate for the following reasons:   The 2019 ASCVD risk score is only valid for ages 26 to 92    Assessment & Plan:   Problem List Items Addressed This Visit       Other   Vitamin D deficiency   Relevant Orders   Vitamin D, 25-hydroxy   Sickle cell trait (HCC)   Relevant Orders   CBC (no diff)   Other Visit Diagnoses     Abnormal TSH    -  Primary   Relevant  Orders   TSH   ADHD (attention deficit hyperactivity disorder), combined type       Relevant Orders   Ambulatory referral to Psychology   Hyperglycemia       Relevant Orders   CMP   Lipid screening       Relevant Orders   Lipid Panel       Return in about 1 year (around 03/30/2023) for Annual Physical Exam.    Karie Georges, MD

## 2022-03-29 NOTE — Patient Instructions (Signed)
Schedule a visit with me after you see the psychologist.

## 2022-03-30 LAB — COMPREHENSIVE METABOLIC PANEL
ALT: 9 U/L (ref 0–35)
AST: 18 U/L (ref 0–37)
Albumin: 3.9 g/dL (ref 3.5–5.2)
Alkaline Phosphatase: 102 U/L (ref 39–117)
BUN: 14 mg/dL (ref 6–23)
CO2: 29 mEq/L (ref 19–32)
Calcium: 9.3 mg/dL (ref 8.4–10.5)
Chloride: 103 mEq/L (ref 96–112)
Creatinine, Ser: 0.94 mg/dL (ref 0.40–1.20)
GFR: 81.87 mL/min (ref >60.00)
Glucose, Bld: 74 mg/dL (ref 70–99)
Potassium: 4.3 mEq/L (ref 3.5–5.1)
Sodium: 138 mEq/L (ref 135–145)
Total Bilirubin: 0.3 mg/dL (ref 0.2–1.2)
Total Protein: 7.3 g/dL (ref 6.0–8.3)

## 2022-03-30 LAB — TSH: TSH: 1.61 u[IU]/mL (ref 0.35–5.50)

## 2022-03-30 LAB — CBC
HCT: 38.6 % (ref 36.0–46.0)
Hemoglobin: 12.8 g/dL (ref 12.0–15.0)
MCHC: 33.2 g/dL (ref 30.0–36.0)
MCV: 83 fl (ref 78.0–100.0)
Platelets: 353 10*3/uL (ref 150.0–400.0)
RBC: 4.66 Mil/uL (ref 3.87–5.11)
RDW: 14.9 % (ref 11.5–15.5)
WBC: 7.3 10*3/uL (ref 4.0–10.5)

## 2022-03-30 LAB — VITAMIN D 25 HYDROXY (VIT D DEFICIENCY, FRACTURES): VITD: 24.57 ng/mL — ABNORMAL LOW (ref 30.00–100.00)

## 2022-03-30 LAB — LIPID PANEL
Cholesterol: 192 mg/dL (ref 0–200)
HDL: 68.5 mg/dL (ref >39.00)
LDL Cholesterol: 108 mg/dL — ABNORMAL HIGH (ref 0–99)
NonHDL: 123.31
Total CHOL/HDL Ratio: 3
Triglycerides: 79 mg/dL (ref 0.0–149.0)
VLDL: 15.8 mg/dL (ref 0.0–40.0)

## 2022-03-30 NOTE — Assessment & Plan Note (Signed)
Needs new CBC today.

## 2022-03-30 NOTE — Assessment & Plan Note (Signed)
Was low on previous labs, needs new level checked today in follow up.

## 2022-03-30 NOTE — Progress Notes (Signed)
Vitamin D is still low,  I recommend she take at least 2000 units of vitamin D daily. She can get this over the counter or I can call it in for her if she would like.

## 2022-04-12 NOTE — Progress Notes (Unsigned)
ACUTE VISIT No chief complaint on file.  HPI: [name] is a 31 y.o. female, who is here today complaining of *** HPI  Review of Systems Rest see pertinent positives and negatives per HPI.  Current Outpatient Medications on File Prior to Visit  Medication Sig Dispense Refill   albuterol (VENTOLIN HFA) 108 (90 Base) MCG/ACT inhaler Inhale 2 puffs into the lungs as needed for wheezing or shortness of breath. 1 Inhaler 2   Ascorbic Acid (VITAMIN C) 1000 MG tablet 1 tablet     cetirizine (ZYRTEC) 10 MG tablet Take 10 mg by mouth daily.     cholecalciferol (VITAMIN D3) 25 MCG (1000 UNIT) tablet 1 tablet     diphenhydrAMINE (BENADRYL) 25 MG tablet 1 tablet at bedtime as needed     ibuprofen (ADVIL,MOTRIN) 600 MG tablet Take 1 tablet (600 mg total) by mouth every 6 (six) hours as needed for moderate pain or cramping. 30 tablet 0   KELNOR 1/35 1-35 MG-MCG tablet Take 1 tablet by mouth daily.     Olopatadine-Mometasone (RYALTRIS) [id number] MCG/ACT SUSP Place 2 sprays per nostril 1-2 times daily as needed for nasal congestion or drainage contro 29 g 2   Omeprazole 20 MG TBDD 1 capsule 30 minutes before morning meal     No current facility-administered medications on file prior to visit.     Past Medical History:  Diagnosis Date   Asthma    Eczema    Sickle cell trait (HCC)    Urticaria    Allergies  Allergen Reactions   Amoxicillin Hives and Itching   Other Other (See Comments)    Strawberries cause discoloration of tongue and pineapple juice cause a tingling sensation in the lips, also allergic to pollenated foods, trees, grass and dogs   Penicillins Hives and Itching    Has patient had a PCN reaction causing immediate rash, facial/tongue/throat swelling, SOB or lightheadedness with hypotension: no Has patient had a PCN reaction causing severe rash involving mucus membranes or skin necrosis: No Has patient had a PCN reaction that required hospitalization: no  Has patient  had a PCN reaction occurring within the last 10 years: yes If all of the above answers are "NO", then may proceed with Cephalosporin use.    Social History   Socioeconomic History   Marital status: Single    Spouse name: Not on file   Number of children: Not on file   Years of education: Not on file   Highest education level: Associate degree: academic program  Occupational History   Not on file  Tobacco Use   Smoking status: Never    Passive exposure: Never   Smokeless tobacco: Never  Vaping Use   Vaping Use: Never used  Substance and Sexual Activity   Alcohol use: Yes    Alcohol/week: 0.0 standard drinks of alcohol    Comment: occasional   Drug use: No   Sexual activity: Yes    Birth control/protection: None  Other Topics Concern   Not on file  Social History Narrative   Not on file   Social Determinants of Health   Financial Resource Strain: Low Risk  (07/22/2021)   Overall Financial Resource Strain (CARDIA)    Difficulty of Paying Living Expenses: Not very hard  Food Insecurity: Food Insecurity Present (07/22/2021)   Hunger Vital Sign    Worried About Running Out of Food in the Last Year: Sometimes true    Ran Out of Food in the Last Year:  Sometimes true  Transportation Needs: No Transportation Needs (07/22/2021)   PRAPARE - Administrator, Civil Service (Medical): No    Lack of Transportation (Non-Medical): No  Physical Activity: Sufficiently Active (07/22/2021)   Exercise Vital Sign    Days of Exercise per Week: 3 days    Minutes of Exercise per Session: 60 min  Stress: Stress Concern Present (07/22/2021)   [employer] of Occupational Health - Occupational Stress Questionnaire    Feeling of Stress : To some extent  Social Connections: Unknown (07/22/2021)   Social Connection and Isolation Panel [NHANES]    Frequency of Communication with Friends and Family: More than three times a week    Frequency of Social Gatherings with Friends and Family: Once a  week    Attends Religious Services: Patient refused    Database administrator or Organizations: No    Attends Engineer, structural: Not on file    Marital Status: Never married    There were no vitals filed for this visit. There is no height or weight on file to calculate BMI.  Physical Exam  ASSESSMENT AND PLAN:  There are no diagnoses linked to this encounter.   No follow-ups on file.   Keshanna Riso G. [place], MD  [facility]. [place] office.  Discharge Instructions   None

## 2022-04-13 ENCOUNTER — Encounter: Payer: Self-pay | Admitting: Family Medicine

## 2022-04-13 ENCOUNTER — Other Ambulatory Visit: Payer: Self-pay | Admitting: Family Medicine

## 2022-04-13 ENCOUNTER — Ambulatory Visit: Payer: Federal, State, Local not specified - PPO | Admitting: Family Medicine

## 2022-04-13 ENCOUNTER — Ambulatory Visit (INDEPENDENT_AMBULATORY_CARE_PROVIDER_SITE_OTHER): Payer: Federal, State, Local not specified - PPO

## 2022-04-13 VITALS — BP 118/70 | HR 82 | Temp 99.2°F | Resp 12 | Ht 62.0 in | Wt 264.5 lb

## 2022-04-13 DIAGNOSIS — J309 Allergic rhinitis, unspecified: Secondary | ICD-10-CM

## 2022-04-13 DIAGNOSIS — J4520 Mild intermittent asthma, uncomplicated: Secondary | ICD-10-CM | POA: Diagnosis not present

## 2022-04-13 DIAGNOSIS — R053 Chronic cough: Secondary | ICD-10-CM | POA: Diagnosis not present

## 2022-04-13 DIAGNOSIS — R059 Cough, unspecified: Secondary | ICD-10-CM | POA: Diagnosis not present

## 2022-04-13 MED ORDER — FLUTICASONE PROPIONATE HFA 110 MCG/ACT IN AERO: 2.0000 | INHALATION_SPRAY | Freq: Two times a day (BID) | RESPIRATORY_TRACT | 0 refills | Status: DC

## 2022-04-13 MED ORDER — BENZONATATE 100 MG PO CAPS: 200.0000 mg | ORAL_CAPSULE | Freq: Two times a day (BID) | ORAL | 0 refills | Status: AC | PRN

## 2022-04-13 NOTE — Patient Instructions (Addendum)
A few things to remember from today's visit:  Cough, persistent - Plan: benzonatate (TESSALON) 100 MG capsule, DG Chest 2 View  Allergic rhinitis, unspecified seasonality, unspecified trigger  Mild intermittent asthma without complication  Albuterol inh 2 puff every 6 hours for a week then as needed for wheezing or shortness of breath.  Benzonatate for cough. Flovent 2 puff 2 times daily, rinse mouth after use.  If you need refills for medications you take chronically, please call your pharmacy. Do not use My Chart to request refills or for acute issues that need immediate attention. If you send a my chart message, it may take a few days to be addressed, specially if I am not in the office.  Please be sure medication list is accurate. If a new problem present, please set up appointment sooner than planned today.

## 2022-04-14 ENCOUNTER — Ambulatory Visit: Payer: Federal, State, Local not specified - PPO | Admitting: Family Medicine

## 2022-05-11 ENCOUNTER — Other Ambulatory Visit: Payer: Self-pay

## 2022-05-11 ENCOUNTER — Encounter: Payer: Self-pay | Admitting: Allergy

## 2022-05-11 ENCOUNTER — Ambulatory Visit: Payer: Federal, State, Local not specified - PPO | Admitting: Allergy

## 2022-05-11 VITALS — BP 124/76 | HR 70 | Temp 97.9°F | Resp 18 | Ht 62.0 in | Wt 264.0 lb

## 2022-05-11 DIAGNOSIS — J3089 Other allergic rhinitis: Secondary | ICD-10-CM | POA: Diagnosis not present

## 2022-05-11 DIAGNOSIS — H1013 Acute atopic conjunctivitis, bilateral: Secondary | ICD-10-CM | POA: Diagnosis not present

## 2022-05-11 DIAGNOSIS — L2089 Other atopic dermatitis: Secondary | ICD-10-CM

## 2022-05-11 DIAGNOSIS — J45998 Other asthma: Secondary | ICD-10-CM | POA: Diagnosis not present

## 2022-05-11 DIAGNOSIS — T781XXD Other adverse food reactions, not elsewhere classified, subsequent encounter: Secondary | ICD-10-CM | POA: Diagnosis not present

## 2022-05-11 DIAGNOSIS — J4520 Mild intermittent asthma, uncomplicated: Secondary | ICD-10-CM

## 2022-05-11 MED ORDER — LEVOCETIRIZINE DIHYDROCHLORIDE 5 MG PO TABS: 5.0000 mg | ORAL_TABLET | Freq: Every evening | ORAL | 5 refills | Status: DC

## 2022-05-11 MED ORDER — FLUTICASONE PROPIONATE HFA 110 MCG/ACT IN AERO: 2.0000 | INHALATION_SPRAY | Freq: Two times a day (BID) | RESPIRATORY_TRACT | 0 refills | Status: DC

## 2022-05-11 MED ORDER — ALBUTEROL SULFATE HFA 108 (90 BASE) MCG/ACT IN AERS: 2.0000 | INHALATION_SPRAY | RESPIRATORY_TRACT | 2 refills | Status: DC | PRN

## 2022-05-11 MED ORDER — RYALTRIS 665-25 MCG/ACT NA SUSP: NASAL | 5 refills | Status: DC

## 2022-05-11 NOTE — Progress Notes (Signed)
Follow-up Note  RE: Angelica Crosby MRN: [id number] DOB: 1991/01/28 Date of Office Visit: 05/11/2022   History of present illness: Angelica Crosby is a 31 y.o. female presenting today for follow-up of allergic rhinitis with conjunctivitis, asthma and eczema.  She was last seen in the office on 10/06/2021 myself.  She states she has been doing better since this visit as she has been using her allergy medications.  She states she is having less nasal, ocular and generalized allergy symptoms.  She currently is still taking Zyrtec.  She did use the Ryaltris nasal spray states it was quite helpful. She has been avoiding strawberries and pineapple related to oral allergy syndrome. She states she was having a cough but seemed to not want to go away.  She states she never really thought to use her albuterol for the cough.  She states she will use albuterol inhaler once a month or so on average.  She discussed the cough with her PCP and she was prescribed Flovent medium dose 2 puffs twice a day that she has been doing for the past 2 to 3 weeks.  She was not provided with a spacer device. She states her asthma has been doing quite well.  She has not needed to use the hydrocortisone cream.  She moisturizes after bathing.  Review of systems: Review of Systems  Constitutional: Negative.   HENT: Negative.    Eyes: Negative.   Respiratory:  Positive for cough.   Cardiovascular: Negative.   Gastrointestinal: Negative.   Musculoskeletal: Negative.   Skin: Negative.   Allergic/Immunologic: Negative.   Neurological: Negative.      All other systems negative unless noted above in HPI  Past medical/social/surgical/family history have been reviewed and are unchanged unless specifically indicated below.  No changes  Medication List: Current Outpatient Medications  Medication Sig Dispense Refill   Ascorbic Acid (VITAMIN C) 1000 MG tablet 1 tablet     cetirizine (ZYRTEC) 10 MG tablet Take 10 mg by  mouth daily.     cholecalciferol (VITAMIN D3) 25 MCG (1000 UNIT) tablet 1 tablet     diphenhydrAMINE (BENADRYL) 25 MG tablet 1 tablet at bedtime as needed     [facility] 1/35 1-35 MG-MCG tablet Take 1 tablet by mouth daily.     levocetirizine (XYZAL) 5 MG tablet Take 1 tablet (5 mg total) by mouth every evening. 30 tablet 5   Omeprazole 20 MG TBDD 1 capsule 30 minutes before morning meal     albuterol (VENTOLIN HFA) 108 (90 Base) MCG/ACT inhaler Inhale 2 puffs into the lungs as needed for wheezing or shortness of breath. 1 each 2   fluticasone (FLOVENT HFA) 110 MCG/ACT inhaler Inhale 2 puffs into the lungs in the morning and at bedtime. 36 g 0   Olopatadine-Mometasone (RYALTRIS) 665-25 MCG/ACT SUSP Place 2 sprays per nostril 1-2 times daily as needed for nasal congestion or drainage contro 29 g 5   No current facility-administered medications for this visit.     Known medication allergies: Allergies  Allergen Reactions   Amoxicillin Hives and Itching   Other Other (See Comments)    Strawberries cause discoloration of tongue and pineapple juice cause a tingling sensation in the lips, also allergic to pollenated foods, trees, grass and dogs   Penicillins Hives and Itching    Has patient had a PCN reaction causing immediate rash, facial/tongue/throat swelling, SOB or lightheadedness with hypotension: no Has patient had a PCN reaction causing severe rash involving mucus  membranes or skin necrosis: No Has patient had a PCN reaction that required hospitalization: no  Has patient had a PCN reaction occurring within the last 10 years: yes If all of the above answers are "NO", then may proceed with Cephalosporin use.     Physical examination: Blood pressure 124/76, pulse 70, temperature 97.9 F (36.6 C), resp. rate 18, height 5\' 2"  (1.575 m), weight 264 lb (119.7 kg), SpO2 97 %.  General: Alert, interactive, in no acute distress. HEENT: PERRLA, TMs pearly gray, turbinates minimally edematous  without discharge, post-pharynx non erythematous. Neck: Supple without lymphadenopathy. Lungs: Clear to auscultation without wheezing, rhonchi or rales. {no increased work of breathing. CV: Normal S1, S2 without murmurs. Abdomen: Nondistended, nontender. Skin: Warm and dry, without lesions or rashes. Extremities:  No clubbing, cyanosis or edema. Neuro:   Grossly intact.  Diagnositics/Labs: Spirometry: FEV1: 2.66L 104%, FVC: 3.07L 103%, ratio consistent with nonobstructive pattern .  This is a normal study and improved from previous numbers she is on a ICS  Assessment and plan: Allergic rhinitis with conjunctivitis - Continue avoidance measures for grasses, trees, outdoor molds, and dog. - Stop taking: Zyrtec.  Can rotate every 6 months between antihistamines to ensure you are not getting "immune" to one - Start taking: Xyzal (levocetirizine) 5mg  tablet once daily - Continue using: Ryaltris (olopatadine/mometasone) two sprays per nostril 1-2 times daily as needed for nasal congestion or drainage control Pataday (olopatadine) one drop per eye daily as needed for itchy, watery eyes - You can use an extra dose of the antihistamine, if needed, for breakthrough symptoms.  - Consider nasal saline rinses 1-2 times daily to remove allergens from the nasal cavities as well as help with mucous clearance (this is especially helpful to do before the nasal sprays are given) - Consider allergy shots as a means of long-term control. - Allergy shots "re-train" and "reset" the immune system to ignore environmental allergens and decrease the resulting immune response to those allergens (sneezing, itchy watery eyes, runny nose, nasal congestion, etc).    - Allergy shots improve symptoms in 75-85% of patients.   Oral allergy syndrome - Food testing for strawberry and pineapple is negative thus this confirms OAS - The oral allergy syndrome (OAS) or pollen-food allergy syndrome (PFAS) is a relatively common form  of food allergy, particularly in adults. It typically occurs in people who have pollen allergies when the immune system "sees" proteins on the food that look like proteins on the pollen. This results in the allergy antibody (IgE) binding to the food instead of the pollen. Patients typically report itching and/or mild swelling of the mouth and throat immediately following ingestion of certain uncooked fruits (including nuts) or raw vegetables. Only a very small number of affected individuals experience systemic allergic reactions, such as anaphylaxis which occurs with true food allergies.    Asthma, mild intermittent - Continue Flovent 2 puffs twice a day with spacer device.   Spacer provided today - Have access to albuterol inhaler 2 puffs every 4-6 hours as needed for cough/wheeze/shortness of breath/chest tightness.  May use 15-20 minutes prior to activity.   Monitor frequency of use.    Asthma control goals:  Full participation in all desired activities (may need albuterol before activity) Albuterol use two time or less a week on average (not counting use with activity) Cough interfering with sleep two time or less a month Oral steroids no more than once a year No hospitalizations  Atopic dermatitis - Bathe and  soak for 5-10 minutes in warm water once a day. Pat dry.  Immediately apply the below cream prescribed to flared areas (red, irritated, dry, itchy, patchy, scaly, flaky) only. Wait several minutes and then apply your moisturizer all over.    To affected areas on the body apply: Hydrocortisone 1% cream twice a day as needed.   - Keep finger nails trimmed.  Follow-up in 6 months or sooner if needed  I appreciate the opportunity to take part in [place] care. Please do not hesitate to contact me with questions.  Sincerely,   Margo Aye, MD Allergy/Immunology [facility]

## 2022-05-11 NOTE — Addendum Note (Signed)
Addended by: Orson Aloe on: 05/11/2022 06:32 PM   Modules accepted: Orders

## 2022-05-11 NOTE — Patient Instructions (Addendum)
-   Continue avoidance measures for grasses, trees, outdoor molds, and dog. - Stop taking: Zyrtec.  Can rotate every 6 months between antihistamines to ensure you are not getting "immune" to one - Start taking: Xyzal (levocetirizine) 5mg  tablet once daily - Continue using: Ryaltris (olopatadine/mometasone) two sprays per nostril 1-2 times daily as needed for nasal congestion or drainage control Pataday (olopatadine) one drop per eye daily as needed for itchy, watery eyes - You can use an extra dose of the antihistamine, if needed, for breakthrough symptoms.  - Consider nasal saline rinses 1-2 times daily to remove allergens from the nasal cavities as well as help with mucous clearance (this is especially helpful to do before the nasal sprays are given) - Consider allergy shots as a means of long-term control. - Allergy shots "re-train" and "reset" the immune system to ignore environmental allergens and decrease the resulting immune response to those allergens (sneezing, itchy watery eyes, runny nose, nasal congestion, etc).    - Allergy shots improve symptoms in 75-85% of patients.   - Food testing for strawberry and pineapple is negative thus this confirms OAS - The oral allergy syndrome (OAS) or pollen-food allergy syndrome (PFAS) is a relatively common form of food allergy, particularly in adults. It typically occurs in people who have pollen allergies when the immune system "sees" proteins on the food that look like proteins on the pollen. This results in the allergy antibody (IgE) binding to the food instead of the pollen. Patients typically report itching and/or mild swelling of the mouth and throat immediately following ingestion of certain uncooked fruits (including nuts) or raw vegetables. Only a very small number of affected individuals experience systemic allergic reactions, such as anaphylaxis which occurs with true food allergies.    - Continue Flovent 09-07-1995 2 puffs twice a day with spacer  device.   Spacer provided today - Have access to albuterol inhaler 2 puffs every 4-6 hours as needed for cough/wheeze/shortness of breath/chest tightness.  May use 15-20 minutes prior to activity.   Monitor frequency of use.    Asthma control goals:  Full participation in all desired activities (may need albuterol before activity) Albuterol use two time or less a week on average (not counting use with activity) Cough interfering with sleep two time or less a month Oral steroids no more than once a year No hospitalizations  - Bathe and soak for 5-10 minutes in warm water once a day. Pat dry.  Immediately apply the below cream prescribed to flared areas (red, irritated, dry, itchy, patchy, scaly, flaky) only. Wait several minutes and then apply your moisturizer all over.    To affected areas on the body apply: Hydrocortisone 1% cream twice a day as needed.   - Keep finger nails trimmed.  Follow-up in 6 months or sooner if needed

## 2022-05-25 ENCOUNTER — Ambulatory Visit: Payer: Federal, State, Local not specified - PPO | Admitting: Family Medicine

## 2022-06-23 ENCOUNTER — Ambulatory Visit: Payer: Federal, State, Local not specified - PPO | Admitting: Family Medicine

## 2022-06-26 DIAGNOSIS — J101 Influenza due to other identified influenza virus with other respiratory manifestations: Secondary | ICD-10-CM | POA: Diagnosis not present

## 2022-06-26 DIAGNOSIS — M791 Myalgia, unspecified site: Secondary | ICD-10-CM | POA: Diagnosis not present

## 2022-06-26 DIAGNOSIS — R059 Cough, unspecified: Secondary | ICD-10-CM | POA: Diagnosis not present

## 2022-06-26 DIAGNOSIS — R509 Fever, unspecified: Secondary | ICD-10-CM | POA: Diagnosis not present

## 2022-07-14 ENCOUNTER — Ambulatory Visit: Payer: Federal, State, Local not specified - PPO | Admitting: Psychology

## 2022-07-14 DIAGNOSIS — F89 Unspecified disorder of psychological development: Secondary | ICD-10-CM

## 2022-07-14 NOTE — Progress Notes (Addendum)
Date: 07/14/2022 Appointment Start Time: 5pm Duration: 80 minutes Provider: Clarice Pole, PsyD Type of Session: Initial Appointment for Evaluation  Location of Patient: Home Location of Provider: Provider's Home (private office) Type of Contact: WebEx video visit with audio  Session Content:  Prior to proceeding with today's appointment, two pieces of identifying information were obtained from Angelica Crosby to verify identity. In addition, Angelica Crosby's physical location at the time of this appointment was obtained. In the event of technical difficulties, Angelica Crosby shared a phone number she could be reached at. Angelica Crosby and this provider participated in today's telepsychological service. Angelica Crosby denied anyone else being present in the room or on the virtual appointment.  The provider's role was explained to Angelica Crosby. The provider reviewed and discussed issues of confidentiality, privacy, and limits therein (e.g., reporting obligations). In addition to verbal informed consent, written informed consent for psychological services was obtained from Angelica Crosby prior to the initial appointment. Written consent included information concerning the practice, financial arrangements, and confidentiality and patients' rights. Since the clinic is not a 24/7 crisis center, mental health emergency resources were shared, and the provider explained e-mail, voicemail, and/or other messaging systems should be utilized only for non-emergency reasons. This provider also explained that information obtained during appointments will be placed in their electronic Angelica record in a confidential manner. Angelica Crosby verbally acknowledged understanding of the aforementioned and agreed to use mental health emergency resources discussed if needed. Moreover, Angelica Crosby agreed information may be shared with other Angelica Crosby or their referring provider(s) as needed for coordination of care. By signing the new patient documents, Angelica Crosby provided written consent for  coordination of care. Angelica Crosby verbally acknowledged understanding she is ultimately responsible for understanding her insurance benefits as it relates to reimbursement of telepsychological and in-person services. This provider also reviewed confidentiality, as it relates to telepsychological services, as well as the rationale for telepsychological services. This provider further explained that video should not be captured, photos should not be taken, nor should testing stimuli be copied or recorded as it would be a copyright violation. Angelica Crosby expressed understanding of the aforementioned, and verbally consented to proceed.  Angelica Crosby completed the psychiatric diagnostic evaluation (history, including past, family, social, and  psychiatric history; behavioral observations; and establishment of a provisional diagnosis). The evaluation was completed in 80 minutes. Code [id number] was billed.   Mental Status Examination:  Appearance:  neat Behavior: appropriate to circumstances Mood: euthymic Affect: mood congruent Speech: pressured and somewhat fast Eye Contact: appropriate Psychomotor Activity: restless Thought Process: linear, logical, and goal directed and denies suicidal, homicidal, and self-harm ideation, plan and intent Content/Perceptual Disturbances: none Orientation: AAOx4 Cognition/Sensorium:  occasionally inattentive as evidenced by her not seeming to hear a question  Insight: fair Judgment: fair  Provisional DSM-5 diagnosis(es):  F89 Unspecified Disorder of Psychological Development   Plan: Angelica Crosby is currently scheduled for an appointment on 07/21/2022 at Angelica Crosby via WebEx video visit with audio.       CONFIDENTIAL PSYCHOLOGICAL EVALUATION ______________________________________________________________________________  Name: Angelica Crosby   Date of Birth: Feb 05, 1991    Age: 32 Dates of Evaluation: 07/14/2022  SOURCE AND REASON FOR REFERRAL: Ms. Angelica Crosby was referred by Dr. Loralyn Crosby for an evaluation to ascertain if she meets criteria for Attention Deficit/Hyperactivity Disorder (ADHD).    BACKGROUND INFORMATION AND PRESENTING PROBLEM: Ms. Angelica Crosby is a 32 year old female who resides in [place]  Angelica Crosby stated "[her] mom thinks Angelica Crosby has] ADHD and [she] think[s] [she] might have a slight form of ADHD too.  She described her ADHD-related concerns as having occurred since childhood and including being easily distracted by her thoughts and sounds and occasional hyperfocus that can involve dyschronometria on "competitive tasks" (e.g., production goals at her employment); tending to miss small details and make careless mistakes (e.g., misreading information on a recipe and making unspecified mistakes on school assignments); occasional trouble sustaining her attention when others are speaking to her despite efforts to do so; regular forgetfulness (e.g., appointments and where she placed items such as her keys); disorganization (e.g., putting items down where convenient to do so versus the designated place) that causes clutter at her home and employment as well as can lead to an extended time being required to find the item and her becoming frustrated; trouble planning for herself, noting it is typically easier to help other's plan; habitual fidgeting and squirming (e.g., her body moving and/or rubbing her fingers together); trouble staying seated for longer than 30-45 minutes at a time as she "[has] to move;" occasionally feeling she has to be moving or doing something "when stressed," although she added she "does not feel stressed too often;" engaging in excessive talking as a child, stating she is "more observant" as an adult; frequently interrupting others due to worries she will forget what she wants to say; task initiation (e.g., putting off various talks "until last minute" which causes stress and "scrambling to complete things" ), maintenance (e.g., forgetting  tasks when she has "multiple tasks going on" and her "brain be fried"), and disengagement (e.g., difficulty ceasing reorganization efforts as it is "stuck on [her] brain that [she has] to do it" which can cause her to be late to events and a negative impact on her ability to complete higher priority tasks) issues, stating a belief these issues happen more frequently to her than others around her; impulsive purchases she later regrets and make it difficult to save; tending to drive [id number] over the speed limit, which has led to three speeding tickets; commonly starting projects or tasks without understanding the directions, which can cause mistakes to be made; missing steps when attempting to follow proper steps or procedures of a task; and being prone to irritability and/or "pulling away" if "someone is telling [her] she is wrong," her attention is broken from a task, multiple people are talking to [her] at one time as it is "hard to focus," or there is a lot of stimuli in the environment; however, it is important to note that information provided by Ms. Angelica Crosby was sometimes somewhat contradictory (e.g., she initially denied having any difficulty multitasking although later indicated she can forget tasks if she has "multiple tasks going on" and initially stating she "does not feel stressed too often" but later shared multiple examples of common occurrences that cause stress). She also described a history of low mood that seemingly occur without an identifiable cause and last for one-to-two days; three panic attacks that occurred seemingly randomly and included difficulty breathing, feeling like she was losing control or dying, and chest discomfort; sleep onset issues that she attributed to her "mind still racing" and "body moving," sleep maintenance problems that include waking up 1-2 times a night, occasionally "talking in her sleep," and only somewhat rested upon waking; overeating- and emotional eating-related  behaviors (e.g., using food as comfort when not actually hungry); and her father not being very involved in her life, stating it has led to "daddy issues," flashbacks, and reduced capacity for joy and love. She expressed a belief her ADHD-related concerns  are consistent and independent of mood but can be exacerbated by stress. She stated her coping strategies include writing information down, having designated places for items, and double checking her work.   Ms. Naim denied awareness of ever experiencing a developmental milestone delay, grade retention, or learning disability diagnosis. She stated throughout schooling she was "on the A B honor roll," but had difficulty with "timed tests" as she would get "stressed and anxious" and doubt herself, which occasionally led to vomiting. She denied having any issues completing homework, although noted she often completed most of her schoolwork at school. She shared she is employed at the [employer]. She denied having any history of employment disciplinary action.   Ms. Gregg described her Angelica history as significant for Vitamin D deficiency that was identified 2-3 years ago and is currently being treated. She also described a history of snoring that can become loud when "really, really tired," noting she spoke with PCP about it and her PCP and her mother (who reportedly has sleep apnea) believe a "sleep study" is not currently needed but may be in the future. She reported when she was approximately 32 years old she was "sleep walking" and "hit [her] head on a table" which resulted in a brief loss of consciousness. She denied awareness of experiencing any lingering effects from this injury. She denied having ever utilized mental health services. She reported use of "sometimes" and "sociably" having 3-4 standard size alcoholic drinks with infrequent use of up to "half a bottle of tequila." She also reported use of Redbull 1-2 times a week (it was previously  daily), a tall Starbucks coffee occasionally on "colder days," and past use of 16.9oz of [facility] daily. She denied ever experiencing psychiatric hospitalization; hypomanic or manic episode; obsessions and compulsion; psychosis; suicidal or homicidal ideation, plan, or intent; or legal involvement. She stated her familial mental health history is significant for "schizophrenia bipolar and something else" (cousin) and ADHD (paternal cousins).  Chart Review: On a 03/29/2022 appointment note Dr. Nira Conn stated "Patient reports that she thinks she might have a history of ADHD, states that she is having more trouble with focusing and attention, states that it was present as a child also. She also reports some impulsivity as well. Would like a referral to [facility]."             Margarite Gouge, PsyD

## 2022-07-19 DIAGNOSIS — Z3040 Encounter for surveillance of contraceptives, unspecified: Secondary | ICD-10-CM | POA: Diagnosis not present

## 2022-07-19 DIAGNOSIS — Z113 Encounter for screening for infections with a predominantly sexual mode of transmission: Secondary | ICD-10-CM | POA: Diagnosis not present

## 2022-07-19 DIAGNOSIS — Z124 Encounter for screening for malignant neoplasm of cervix: Secondary | ICD-10-CM | POA: Diagnosis not present

## 2022-07-19 DIAGNOSIS — Z01411 Encounter for gynecological examination (general) (routine) with abnormal findings: Secondary | ICD-10-CM | POA: Diagnosis not present

## 2022-07-19 DIAGNOSIS — Z6841 Body Mass Index (BMI) 40.0 and over, adult: Secondary | ICD-10-CM | POA: Diagnosis not present

## 2022-07-19 DIAGNOSIS — R87610 Atypical squamous cells of undetermined significance on cytologic smear of cervix (ASC-US): Secondary | ICD-10-CM | POA: Diagnosis not present

## 2022-07-21 ENCOUNTER — Ambulatory Visit: Payer: Federal, State, Local not specified - PPO | Admitting: Psychology

## 2022-07-21 DIAGNOSIS — F89 Unspecified disorder of psychological development: Secondary | ICD-10-CM

## 2022-07-21 NOTE — Progress Notes (Signed)
Date: 07/21/2022   Appointment Start Time: 9:05am (patient was experiencing tech issues) Duration: 60 minutes Provider: Clarice Pole, PsyD Type of Session: Testing Appointment for Evaluation  Location of Patient: Home Location of Provider: Provider's Home (private office) Type of Contact: WebEx video visit with audio  Session Content: Today's appointment was a telepsychological visit due to COVID-19. Ryker is aware it is her responsibility to secure confidentiality on her end of the session. Prior to proceeding with today's appointment, Robbi's physical location at the time of this appointment was obtained as well a phone number she could be reached at in the event of technical difficulties. Laurita denied anyone else being present in the room or on the virtual appointment. This provider reviewed that video should not be captured, photos should not be taken, nor should testing stimuli be copied or recorded as it would be a copyright violation. Jenness expressed understanding of the aforementioned, and verbally consented to proceed. The WAIS-IV was administered, scored, and interpreted by this evaluator  Billing codes will be input on the feedback appointment. There are no billing codes for the testing appointment.   Provisional DSM-5 diagnosis(es):  F89 Unspecified Disorder of Psychological Development   Plan: Kamryn was scheduled for a feedback appointment on 07/27/2022 at 10am via WebEx video visit with audio.                Dolores Lory, PsyD

## 2022-07-27 ENCOUNTER — Ambulatory Visit (INDEPENDENT_AMBULATORY_CARE_PROVIDER_SITE_OTHER): Payer: Federal, State, Local not specified - PPO | Admitting: Psychology

## 2022-07-27 DIAGNOSIS — F89 Unspecified disorder of psychological development: Secondary | ICD-10-CM

## 2022-07-27 NOTE — Progress Notes (Signed)
Testing and Report Writing Information: The following measures  were administered, scored, and interpreted by this provider:  Generalized Anxiety Disorder-7 (GAD-7; 5 minutes), Patient Health Questionnaire-9 (PHQ-9; 5 minutes), Wechsler Adult Intelligence Scale-Fourth Edition (WAIS-IV; 70 minutes), CNS Vital Signs (45 minutes), Adult Attention Deficit/Hyperactivity Disorder Self-Report Scale Checklist (ASRSv1.1; 15 minutes), Behavior Rating Inventory for Executive Function - A - Self Report (BRIEF A; 10 minutes) and Behavior Rating Inventory for Executive Function - A - Informant (BRIEF-A; 10 minutes), PTSD Checklist for DSM-5 (PCL-5; 10 minutes), [place] Sleep Quality Index (PSQI; 15) SF-10  Personality Assessment Inventory (PAI; 50 minutes). A total of 225 minutes was spent on the administration and scoring of the aforementioned measures. Codes [id number] and [phone] (6 units) were billed.  Please see the assessment for additional details. This provider completed the written report which includes integration of patient data, interpretation of standardized test results, interpretation of clinical data, review of information provided by [facility] and any collateral information/documentation, and clinical decision making (260 minutes in total).  Feedback Appointment: Date: 07/27/2022 Appointment Start Time: 10:05am Duration: 45 minutes Provider: Clarice Pole, PsyD Type of Session: Feedback Appointment for Evaluation  Location of Patient: Home Location of Provider: Provider's Home (private office) Type of Contact: WebEx video visit with audio  Session Content: Today's appointment was a telepsychological visit due to COVID-19. Elyanah is aware it is her responsibility to secure confidentiality on her end of the session. She provided verbal consent to proceed with today's appointment. Prior to proceeding with today's appointment, Shara's physical location at the time of this appointment was obtained as well a  phone number she could be reached at in the event of technical difficulties. Jaelie denied anyone else being present in the room or on the virtual appointment.  This provider and Graylin Shiver completed the interactive feedback session which includes reviewing the aforementioned measures, treatment recommendations, and diagnostic conclusions.   The interactive feedback session was completed today and a total of 45 minutes was spent on feedback. Code [id number] was billed for feedback session.   DSM-5 Diagnosis(es):  F90.2 Attention-Deficit/Hyperactivity Disorder, Combined Presentation, Mild F43.8 Other Specified Trauma- and Stressor-Related Disorder, Subthreshold PTSD  Time Requirements: Assessment scoring and interpreting: 225 minutes (billing code [phone] and [phone] [6 units]) Feedback: 45 minutes (billing code [phone]) Report writing: 260 total minutes. 07/16/2022: 3:30-4:45pm and 9:15-9:25pm. 07/20/2022: 5:05-6:30pm. 07/21/2022: 10:10-10:35am. 07/23/2022: 8:50-9:25am, 4:20-4:30pm, and 9:15-9:25pm. 07/24/2022: 8:20-8:30pm.(billing code [id number] [4 units])  Plan: Graylin Shiver provided verbal consent for her evaluation to be sent via e-mail. No further follow-up planned by this provider.        CONFIDENTIAL PSYCHOLOGICAL EVALUATION ______________________________________________________________________________  Name: Angelica Crosby   Date of Birth: [birth year]    Age: 89 Dates of Evaluation: 07/14/2022, 07/19/2022, and 07/21/2022  SOURCE AND REASON FOR REFERRAL: Angelica Crosby was referred by Dr. Loralyn Freshwater for an evaluation to ascertain if she meets criteria for Attention Deficit/Hyperactivity Disorder (ADHD).   EVALUATIVE PROCEDURES: Clinical Interview with Ms. Makaylin Hillegas (07/14/2022) Wechsler Adult Intelligence Scale-Fourth Edition (WAIS-IV; 07/21/2022) CNS Vital Signs (07/19/2022) Adult Attention Deficit/Hyperactivity Disorder Self-Report Scale Checklist (07/19/2022) Behavior Rating Inventory for Executive  Function - A - Self Report Behavior Rating Inventory for Executive Function - A - Self Report (BRIEF-A; 07/19/2022) and Informant (07/19/2022) Personality Assessment Inventory (PAI; 07/19/2022) Patient Health Questionnaire-9 (PHQ-9) Generalized Anxiety Disorder-7 (GAD-7) PTSD Checklist for DSM-5 (PCL-5; 07/19/2022) [place] Sleep Quality Index (PSQI; 07/19/2022) SF-10  BACKGROUND INFORMATION AND PRESENTING PROBLEM: Angelica Crosby is a 32 year old female who resides in [place] (  [place]).  Ms. Angelica Crosby stated "[her] mom thinks Angelica Crosby has] ADHD and [she] think[s] [she] might have a slight form of ADHD too." She described her ADHD-related concerns as having occurred since childhood and including being easily distracted by her thoughts and sounds; occasional hyperfocus that can involve dyschronometria on "competitive tasks" (e.g., production goals at her employment); tending to miss small details and make careless mistakes (e.g., misreading information on a recipe and making unspecified mistakes on school assignments); occasional trouble sustaining her attention when others are speaking to her despite efforts to do so; regular forgetfulness (e.g., appointments and where she placed items such as her keys); disorganization (e.g., putting items down where convenient to do so versus the designated place) that causes clutter at her home and employment as well as can lead to an extended time being required to find the item and her becoming frustrated; trouble planning for herself, noting it is typically easier to help other's plan; habitual fidgeting and squirming (e.g., her body moving and/or rubbing her fingers together); trouble staying seated for longer than 30-45 minutes at a time as she "[has] to move;" occasionally feeling she has to be moving or doing something "when stressed," although she added she "does not feel stressed too often;" engaging in excessive talking as a child, stating she is "more observant" as  an adult; frequently interrupting others due to worries she will forget what she wants to say; task initiation (e.g., putting off various talks "until last minute" which causes stress and "scrambling to complete things" ), maintenance (e.g., forgetting tasks when she has "multiple tasks going on" and her "brain be fried"), and disengagement (e.g., difficulty ceasing reorganization efforts as it is "stuck on [her] brain that [she has] to do it" which can cause her to be late to events and a negative impact on her ability to complete higher priority tasks) issues, stating a belief these issues happen more frequently to her than others around her; impulsive purchases she later regrets and make it difficult to save; tending to drive [phone] over the speed limit, which has led to three speeding tickets; commonly starting projects or tasks without understanding the directions, which can cause mistakes to be made; missing steps when attempting to follow proper steps or procedures of a task; and being prone to irritability and/or "pulling away" if "someone is telling [her] she is wrong," her attention is broken from a task, multiple people are talking to [her] at one time as it is "hard to focus," or there is a lot of stimuli in the environment; however, it is important to note that information provided by Ms. Pickron was sometimes somewhat contradictory (e.g., she initially denied having any difficulty multitasking although later indicated she can forget tasks if she has "multiple tasks going on" and initially stating she "does not feel stressed too often" but later shared multiple examples of common occurrences that cause stress). She also described a history of low mood that seemingly occur without an identifiable cause and last for one-to-two days; three panic attacks that occurred seemingly randomly and included difficulty breathing, feeling like she was losing control or dying, and chest discomfort; sleep onset issues  that she attributed to her "mind still racing" and "body moving," sleep maintenance problems that include waking up 1-2 times a night, occasionally "talking in her sleep," and only somewhat rested upon waking; overeating- and emotional eating-related behaviors (e.g., using food as comfort when not actually hungry); and her father not being very involved in her life,  stating it has led to "daddy issues," flashbacks, and reduced capacity for joy and love. She expressed a belief her ADHD-related concerns are consistent and independent of mood but can be exacerbated by stress. She stated her coping strategies include writing information down, having designated places for items, and double checking her work.   Ms. Cathell denied awareness of ever experiencing a developmental milestone delay, grade retention, or learning disability diagnosis. She stated throughout schooling she was "on the A B honor roll," but had difficulty with "timed tests" as she would get "stressed and anxious" and doubt herself, which occasionally led to vomiting. She denied having any issues completing homework, although noted she often completed most of her schoolwork at school. She shared she is employed at the [employer]. She denied having any history of employment disciplinary action.   Ms. Giasson described her medical history as significant for Vitamin D deficiency that was identified 2-3 years ago and is currently being treated. She also described a history of snoring that can become loud when "really, really tired," noting she spoke with PCP about it and her PCP and her mother (who reportedly has sleep apnea) believe a "sleep study" is not currently needed but may be in the future. She reported when she was approximately 32 years old she was "sleep walking" and "hit [her] head on a table" which resulted in a brief loss of consciousness. She denied awareness of experiencing any lingering effects from this injury. She also denied having  ever utilized mental health services. She reported use of "sometimes" and "sociably" having 3-4 standard size alcoholic drinks with infrequent use of up to "half a bottle of tequila." She also reported use of Redbull 1-2 times a week (it was previously daily), a tall Starbucks coffee occasionally on "colder days," and past use of 16.9oz of [facility] daily. She denied ever experiencing psychiatric hospitalization; hypomanic or manic episode; obsessions and compulsion; psychosis; suicidal or homicidal ideation, plan, or intent; or legal involvement. She stated her familial mental health history is significant for "schizophrenia bipolar and something else" (cousin) and ADHD (paternal cousins).  Chart Review: On a 03/29/2022 appointment note Dr. Loralyn Freshwater stated "[Ms. Lakins] reports that she thinks she might have a history of ADHD, states that she is having more trouble with focusing and attention, states that it was present as a child also. She also reports some impulsivity as well. Would like a referral to [facility]."  BEHAVIORAL OBSERVATIONS: Ms. Pecorella presented on time for the evaluation. She was well-groomed. She was oriented to time, place, person, and purpose of the appointment. During the evaluation Ms. Marzullo often appeared restless and fidgety (e.g., adjusting her sitting or standing position, moving around the room, and making various facial gestures) as well as demonstrated and/or verbalized doubt (e.g., occasionally saying "No. Yeah.", expressing a belief she needs to "go back to school," and stating answers with a questioning tone), working memory-related difficulties (e.g., stating "Oh lord," "You done lost me." or "I am missing a number" and/or grimacing on Digit Span tasks as well as noting "This [Arithmetic task] is hard without pen and paper" and repeating things out loud and using her fingers to assist in remembering and manipulating verbally provided information). Throughout the course of  the evaluation, she maintained appropriate eye contact. Her thought processes and content were logical, coherent, and goal-directed. There were no overt signs of a thought disorder or perceptual disturbances, nor did she report such symptomatology. There was no evidence of paraphasias (  i.e., errors in speech, gross mispronunciations, and word substitutions), repetition deficits, or disturbances in volume or prosody (i.e., rhythm and intonation). Overall, based on Ms. Buol' approach to testing, the current results are believed to be a good estimate of her abilities.  PROCEDURAL CONSIDERATIONS:  Psychological testing measures were conducted through a virtual visit with video and audio capabilities, but otherwise in a standard manner.   The Wechsler Adult Intelligence Scale, Fourth Edition (WAIS-IV) was administered via remote telepractice using digital stimulus materials on [employer]'s Q-global system. The remote testing environment appeared free of distractions, adequate rapport was established with the examinee via video/audio capabilities, and Ms. Vesco appeared appropriately engaged in the task throughout the session. No significant technological problems or distractions were noted during administration. Modifications to the standardization procedure included: none. The WAIS-IV subtests, or similar tasks, have received initial validation in several samples for remote telepractice and digital format administration, and the results are considered a valid description of Ms. Antwine' skills and abilities.  CLINICAL FINDINGS:  COGNITIVE FUNCTIONING  Wechsler Adult Intelligence Scale, Fourth Edition (WAIS-IV): Ms. Duro completed subtests of the WAIS-IV, a full-scale measure of cognitive ability. She completed subtests of the WAIS-IV, a full-scale measure of cognitive ability. The WAIS-IV is comprised of four indices that measure cognitive processes that are components of intellectual ability; however, only  subtests from the Verbal Comprehension and Working Memory indices were administered. As a result, Full-Scale-IQ (FSIQ) and General Ability Index (GAI) were unable to be determined.   WAIS-IV Scale/Subtest IQ/Scaled Score 95% Confidence Interval Percentile Rank Qualitative Description  Verbal Comprehension (VCI) 91 86-97 27 Average  Similarities 8     Vocabulary 9     Information 8     Working Memory (WMI) 95 89-102 37 Average  Digit Span 10     Arithmetic 8       The Verbal Comprehension Index (VCI) provides a measure of one's ability to receive, comprehend, and express language. It also measures the ability to retrieve previously learned information and to understand relationships between words and concepts presented orally. Ms. Oja obtained a VCI scaled score of 91 (27th percentile) placing her in the average range compared to same-aged peers. Her performance on the subtests comprising this index was similar, suggesting the various verbal cognitive abilities measured by the subtests are similarly developed.    The Working Memory Index ([place]) provides a measure of one's ability to sustain attention, concentrate, and exert mental control. Ms. Lucius obtained a WMI scaled score of 95 (37th percentile), placing her in the average range compared to same-aged peers. Ms. Devany demonstrated similar performance on the subtests comprising this index. While her lower score on Arithmetic than Digit Span suggests a potential relative weakness in arithmetic computational skills, she commonly demonstrated working memory-related issues on the Arithmetic subtest which likely negatively impacted her performance. Moreover, she indicated she would have performed better on the Arithmetic subtest if she was able to use a pen and paper to keep track of verbally provided information.   ATTENTION AND PROCESSING  CNS Vital Signs: The CNS Vital Signs assessment evaluates the neurocognitive status of an individual and  covers a range of mental processes. The results of the CNS Vital Signs testing indicated average neurocognitive processing ability. Regarding attention, simple attention and sustained attention were in the average range, while complex attention was impaired in the low range. Her executive function and cognitive flexibility scores were similar, although executive function was in the low average range and cognitive flexibility  was low. Psychomotor speed and motor speed were strengths in the above range. Reaction time and processing speed were average, which indicates average hand-eye coordination, thinking speed, and responsiveness. Visual memory (images) and verbal memory (words) were average, although her visual memory score was 13 points higher than verbal memory as well as approached the above range which indicates it is a relative strength. The results suggest Ms. Amaro experiences complex attention and cognitive flexibility impairment, a weakness in executive function, and strengths in psychomotor speed and motor speed.   Domain  Standard Score Percentile Validity Indicator Guideline  Neurocognitive Index 92 30 Yes Average  Composite Memory 103 58 Yes Average  Verbal Memory 96 40 Yes Average  Visual Memory 109 73 Yes Average  Psychomotor Speed 113 81 Yes Above  Reaction Time 91 27 Yes Average  Complex Attention 76 5 Yes Low  Cognitive Flexibility 79 8 Yes Low  Processing Speed  101 53 Yes Average  Executive Function 82 12 Yes Low Average  Working Memory 103 58 Yes Average  Sustained Attention 97 42 Yes Average  Simple Attention 107 68 Yes Average  Motor Speed 115 84 Yes Above   EXECUTIVE FUNCTION  Behavior Rating Inventory of Executive Function, Second Edition (BRIEF-A) Self-Report:  Ms. Lilienthal completed the Self-Report Form of the Behavior Rating Inventory of Executive Function-Adult Version (BRIEF-A), which has three domains that evaluate cognitive, behavioral, and emotional regulation,  and a Global Executive Composite score provides an overall snapshot of executive functioning. There are no missing item responses in the protocol.  The Negativity, Infrequency, and Inconsistency scales are not elevated, suggesting she did not respond to the protocol in an overly negative, haphazard, extreme, or inconsistent manner. In the context of these validity considerations, ratings of Ms. Roger's everyday executive function suggest some areas of concern. The overall index, the Global Executive Composite (GEC), was elevated (GEC T = 65, %ile = 93). The Behavioral Regulation Index (BRI) was within normal limits (BRI T = 60, %ile = 85) and the Metacognition Index (MI) was elevated (MI T = 67, %ile = 96). Ms. Sandiego indicated difficultly with her ability to adjust to changes in routine or task demands, monitor social behavior, attend to task-oriented output, and organize environment and materials. She did not describe her ability to inhibit impulsive responses, modulate emotions, initiate problem solving or activity, sustain working memory, and plan and organize problem-solving approaches as problematic, although working memory and plan/organize scales approached an abnormal elevation.    Scale/Index  Raw Score T Score Percentile  Inhibit 14 58 86  Shift 13 69 98  Emotional Control 14 48 52  Self-Monitor 13 68 95  Behavioral Regulation Index (BRI) 54 60 85  Initiate 14 57 85  Working Memory 15 64 92  Plan/Organize 19 64 90  Task Monitor 12 65 96  Organization of Materials 20 71 98  Metacognition Index (MI) 80 67 96  Global Executive Composite (GEC) 134 65 93   Validity Scale Raw Score Cumulative Percentile Protocol Classification  Negativity 2 0 - 98.3 Acceptable  Infrequency 0 0 - 97.3 Acceptable  Inconsistency 2 0 - 99.2 Acceptable   Behavior Rating Inventory of Executive Function, Second Edition (BRIEF-A) Informant:  Ms. Roger's sibling, Isaias Cowman, completed the Informant Form of the  Behavior Rating Inventory of Executive Function-Adult Version (BRIEF-A), which is equivalent to the Self-Report version and has three domains that evaluate cognitive, behavioral, and emotional regulation, and a Global Executive Composite score provides an overall snapshot of  executive functioning. There are no missing item responses in the protocol.  The Negativity, Infrequency, and Inconsistency scales are not elevated, suggesting they did not respond to the protocol in an overly negative, haphazard, extreme, or inconsistent manner. In the context of these validity considerations, Alexis Austin's ratings of Ms. Roger's everyday executive function suggest some areas of concern. The overall index, the Global Executive Composite (GEC), was elevated (GEC T = 68, %ile = 93). Both the Behavioral Regulation (BRI) and the Metacognition (MI) Indexes were elevated (BRI T = 65, %ile = 91 and MI T = 69, %ile = 93). The overall index, the Global Executive Composite (GEC), was elevated (GEC T = 68, %ile = 93). Both the Behavioral Regulation (BRI) and the Metacognition (MI) Indexes were elevated (BRI T = 65, %ile = 91 and MI T = 69, %ile = 93). Isaias Cowman indicated Ms. Commerford experiences difficultly with her ability to inhibit impulsive responses, adjust to changes in routine or task demands, monitor social behavior, initiate problem solving or activity, sustain working memory, and plan and organize problem-solving approaches. Isaias Cowman did not describe her ability to modulate emotions, attend to task-oriented output, and organize environment and materials as problematic, although task monitor and organization of materials scales approached an abnormal elevation. The elevated scores on the Inhibit scale as well as the Behavioral Regulation and the Metacognition Indexes, suggest Ms. Feeser is perceived as having poor inhibitory control and/or suggest more global behavioral dysregulation is having a negative effect on active  metacognitive problem solving.  Scale/Index  Raw Score T Score Percentile  Inhibit 18 71 96  Shift 13 66 94  Emotional Control 18 55 73  Self-Monitor 14 66 96  Behavioral Regulation Index (BRI) 63 65 91  Initiate 18 68 93  Working Memory 17 69 95  Plan/Organize 23 70 96  Task Monitor 12 63 93  Organization of Materials 19 64 93  Metacognition Index (MI) 89 69 93  Global Executive Composite (GEC) 152 68 93   Validity Scale Raw Score Cumulative Percentile Protocol Classification  Negativity 1 0 - 98.5 Acceptable  Infrequency 0 0 - 93.3 Acceptable  Inconsistency 3 0 - 98.8 Acceptable   BEHAVIORAL FUNCTIONING   Patient Health Questionnaire-9 (PHQ-9): Ms. Stanwood completed the PHQ-9, a self-report measure that assesses symptoms of depression. She scored 7/27, which indicates mild depression.   Generalized Anxiety Disorder- 7 (GAD-7): Ms. Bartle completed the GAD-7, a self-report measure that assesses symptoms of anxiety. She scored 4/21, which indicates minimal anxiety.   Adult ADHD Self-Report Scale Symptom Checklist (ASRS): Ms. Tagliaferri reported the following symptoms as sometimes: problems remembering appointments or obligations, avoiding or delaying getting started on tasks requiring a lot of thought, making careless mistakes when working on boring or difficult projects, struggling to sustain attention when doing boring or repetitive work, struggling to concentrate on what people say even when they are speaking directly to her, difficulty relaxing, and interrupting others or finishing their sentences. She endorsed the following symptoms as occurring often: difficulty wrapping up final details of a project following the completion of challenging aspects, difficulty getting things in order when a task requires organization, feeling overly active and compelled to do things, misplacing or has difficulty finding things, being distracted by noise around her, feeling restless or fidgety, and  interrupting others when they are busy. She endorsed the following symptom as very often: fidgeting or squirming. Endorsement of at least four items in Part A is highly consistent with ADHD in  adults. The frequency scores of Part B provides additional cues. Ms. Bo scored a 5/6 on Part A and 6/12 on Part B, which is considered a positive screening for ADHD.   [place] Sleep Quality Index (PSQI) SF-10: Ms. Helwig total score was 6/21 which indicates she experienced some sleep quality issues and disturbances over the past month. She indicated receiving approximately six hours of sleep each night, that she cannot get to sleep with 30 minutes less than once a week; wakes up in the middle of the night once or twice a week, although she later indicated she wakes up at least twice a night; has to get up to use the bathroom once or twice a week, coughs or snores loudly less than once a week; feels too hot once or twice a week; and has pain three or more times a week. She described her sleep quality as fairly good and that trouble keeping up enthusiasm to get things done is somewhat of a problem. Upon follow-up, Ms. Giannelli noted she wakes up approximately twice a night.   PTSD Checklist for DSM-5 (PCL-5): The PCL-5 was administered. Ms. Srinivasan scored a 30/80. She endorsed repeated, disturbing, and unwanted memories of the stressful experience (a little bit); repeated, disturbing dreams of the stressful experience (a little bit); flashbacks (a little bit); feeling very upset when something reminds you of the stressful experience (quite a bit); having strong physical reactions when something reminds you of the stressful experience (a little bit); avoiding memories, thoughts, or feelings related to the stressful experience (extremely); avoiding external reminders of the stressful experience (extremely); trouble remembering important parts of the stressful experience (quite a bit); having strong negative beliefs about  yourself, other people, or the world (a little bit); blaming yourself or someone else for the stressful experience or what happened after it (moderately); having strong negative feelings such as fear, horror, anger, guilt, or shame (not at all); loss of interest in activities you used to enjoy (a little bit); feeling distant or cut off from other people (a little bit); trouble experiencing positive feelings (a little bit); irritable behavior, angry outbursts, or acting aggressively (a little bit); taking too many risks or doing things that could cause you harm (not at all); being superalert or watchful or on guard (a little bit); feeling jumpy or easily startled (not at all); having difficulty concentrating (a little bit); and trouble falling or staying asleep (quite a bit).   Personality Assessment Inventory (PAI): The PAI is an objective inventory of adult personality. The validity indicators suggested Ms. Roger's profile is interpretable (ICN T = 58, INF T = 51 NIM T = 55, and PIM T = 41). She is endorsing probably having experienced a past disturbing event(s) (ARD-T T = 72) which may have at least partially contributed to being pragmatic or skeptical in relationships with others (PAR-H T = 60, BOR-N T = 62, and WRM T = 44) and having little interest in the lives of others (SCZ-S T = 64), vegetative signs of depression (e.g., sleep disturbance, fatigue, and/or changes in appetite; DEP-P T = 60), problems with concentration and decision making (SCZ-T T = 61), and uncertainty about major life issues and difficulties in maintaining a sense of purpose (BOR-I T = 68). She indicated being assertive and not intimated by confrontation which may also lead to her becoming verbally aggressive at times (AGG-V T = 65 and DOM T = 63). She described having close and generally supportive relationships with friends and family (  NON T = 42). She also expressed having significant difficulties in functioning and the perception that  help is needed in dealing with these problems (RXR T = 38).     SUMMARY AND CLINICAL IMPRESSIONS: Ms. Aniyla Harling is a 32 year old female who was referred by Dr. Loralyn Freshwater for an evaluation to determine if she currently meets criteria for a diagnosis of Attention-Deficit/Hyperactivity Disorder (ADHD).   Ms. Pask noted she and her mother believe she may have ADHD which led to her pursuing this evaluation. She also described a history of low mood that lasts for one-to-two days; having experienced three panic attacks; sleep onset and maintenance issues; overeating- and emotional eating-related behaviors (e.g., using food as comfort when not actually hungry); and her father not being very involved in her life which she noted has caused trauma- and stressor-related disorder symptomatology. She expressed a belief her ADHD-related concerns are consistent and independent of mood but can be exacerbated by stress.  During the evaluation, Ms. Fullam was administered assessments to measure her current cognitive abilities. Her verbal comprehension abilities as well as ability to sustain attention, concentrate, and exert mental control were in the average range, and her performance on the subtests was similar. Results of the CNS Vital Signs indicated an average neurocognitive processing ability, although she demonstrated complex attention and cognitive flexibility impairment, weakness in executive function, and strengths in psychomotor speed and motor speed.   During the clinical interview and on self-report measures, Ms. Grove endorsed executive functioning impairment and attentional dysregulation, hyperactivity- and impulsivity-related symptoms, and meeting full criteria for ADHD. Moreover, her sibling, Isaias Cowman, indicated she experienced significant executive functioning issues. When considering self-reported symptoms; endorsed and/or demonstrated impairment or weakness on measures of attention,  executive functioning, cognitive flexibility, and working memory; and a reported familial history of ADHD, a diagnosis of F90.2 Attention-Deficit/Hyperactivity Disorder, Combined Presentation, Mild appears warranted. The specifier of "Mild" was assigned as she did not endorse symptoms in significant excess of what is needed to make the diagnosis. She described various negative impacts her ADHD-related concerns cause on academic (e.g., trouble with "timed tests"), social (e.g., frequently interrupts others and occasionally has trouble sustaining attention while others are talking to her), and daily (e.g., task initiation and completion issues as well as regular forgetfulness) functioning, although indicated they are not causing marked impairment.   Ms. Burgueno also endorsed a history of one-to-two day periods of low mood; sleep maintenance problems that include waking up 1-2 times a night, occasionally "talking in her sleep," and only somewhat rested upon waking; overeating- and emotional eating-related behaviors (e.g., using food as comfort when not actually hungry); and her father not being very involved in her life, stating it has led to "daddy issues," flashbacks, and reduced capacity for joy and love. As a result, the PHQ-9, GAD-7, PSQI, PCL-5, and PAI. The aforementioned results indicated mild depression-related symptomatology, minimal anxiety-related symptomatology, experiencing some sleep quality issues and disturbances, and subthreshold PTSD-related symptomatology. When considering information provided during the interview and on the measures, she endorsed many PTSD-related symptomatology that she indicated negatively impacts her functioning and cause distress, but not at a level that is suggestive of meeting full criteria for PTSD. 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 Her ADHD-related symptomatology does not appear to  be better explained by trauma-related concerns as she indicated her ADHD-related concerns are independent of mood and trauma reminders. Moreover, some of her endorsed symptomatology is less  commonly associated with trauma- and stressor-related disorders (e.g., habitual fidgeting and regular interruption of others). Based on information provided during this evaluation, it does not appear she meets full criteria for major depressive disorder, generalized anxiety disorder, or a sleep-wake disorder.   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 Noble would likely benefit from making use of strategies for ADHD symptoms:  Setting a timer to complete tasks. Breaking tasks into manageable chunks and spreading them out over longer periods of time with breaks.  Utilizing lists and day calendars to keep track of tasks.  Answering emails daily.  Improving listening skills by asking the speaker to give information in smaller chunks and asking for explanation for clarification as needed. Leaving more than the anticipated time to complete tasks. It may help to keep tasks brief, well within your attention span, and a mix of both high and low interest tasks. Tasks may be gradually increased in length. Practice proactive planning by setting aside time every evening to plan for the next day (e.g., prepare needed materials or pack the car the night before).  Learn how to make an effective and reasonable "to do" list of important tasks and priorities and always keep it easily accessible. Make additional copies in case it is lost or misplaced. Utilize visual reminders by posting appointments, "to do lists," or schedule in strategic areas at home and at work.  Practice using an appointment book, smart phone or other tech device, or a daily planning calendar,  and learn to write down appointments and commitments immediately. Keep notepads or use a portable audio recorder to capture important ideas that would be beneficial to recall later. Learn and practice time management skills. Purchase a programmable alarm watch or set an alarm on smartphone to avoid losing track of time.  Use a color-coded file system, desk and closet organizers, storage boxes, or other organization devices to reduce clutter and improve efficiency and structure.  Implement ways to become more aware of your actions and to inhibit or adjust them as warranted (e.g., reviewing videos of your actions, consider consequences of obeying or not obeying the rules of various upcoming situations, have a trusted other to discuss plans with and/or provide cues to stop certain behaviors, and make visual cues for rules you would like to follow). Stay flexible and be prepared to change your plans as symptom breakthroughs and crises are likely to occur periodically. Ms. Ekman may benefit from mindfulness training to address symptoms of inattention.  Ms. Lores may benefit from a consultation regarding medication for ADHD symptoms.   Individual therapeutic services may assist in processing a diagnosis of ADHD and discussing coping and compensatory strategies. Mental alertness/energy can be raised by increasing exercise; improving sleep; eating a healthy diet; and managing stress. Consulting with a physician regarding any changes to physical regimen is recommended. "Failing at Normal: An ADHD Success Story" by Mellody Drown is a great overview of ADHD.  Organizations that are a good source of information on ADHD:  Children and Adults with Attention-Deficit/Hyperactivity Disorder (CHADD): chadd.org  Attention Deficit Disorder Association (ADDA) Condo[employer].com.cy ADD Resources: addresources.org ADD WareHouse: addwarehouse.com World Federation of ADHD: adhd-federation.org ADDConsults: addconsults.com Future  evaluation if deemed necessary and/or to determine effectiveness of recommended interventions.   Clarice Pole, Psy.D. Licensed Psychologist - [facility] [id number]             Dolores Lory, PsyD

## 2022-08-17 ENCOUNTER — Ambulatory Visit (INDEPENDENT_AMBULATORY_CARE_PROVIDER_SITE_OTHER): Payer: Federal, State, Local not specified - PPO

## 2022-08-17 ENCOUNTER — Ambulatory Visit: Payer: Federal, State, Local not specified - PPO | Admitting: Surgical

## 2022-08-17 ENCOUNTER — Encounter: Payer: Self-pay | Admitting: Surgical

## 2022-08-17 DIAGNOSIS — M25562 Pain in left knee: Secondary | ICD-10-CM

## 2022-08-17 DIAGNOSIS — M659 Synovitis and tenosynovitis, unspecified: Secondary | ICD-10-CM

## 2022-08-17 MED ORDER — METHYLPREDNISOLONE ACETATE 40 MG/ML IJ SUSP
40.0000 mg | INTRAMUSCULAR | Status: AC | PRN
  Administered 2022-08-17: 40 mg via INTRA_ARTICULAR

## 2022-08-17 MED ORDER — BUPIVACAINE HCL 0.25 % IJ SOLN
4.0000 mL | INTRAMUSCULAR | Status: AC | PRN
  Administered 2022-08-17: 4 mL via INTRA_ARTICULAR

## 2022-08-17 MED ORDER — LIDOCAINE HCL 1 % IJ SOLN
5.0000 mL | INTRAMUSCULAR | Status: AC | PRN
  Administered 2022-08-17: 5 mL

## 2022-08-17 NOTE — Progress Notes (Signed)
Office Visit Note   Patient: Angelica Crosby           Date of Birth: 05/13/1991           MRN: [id number] Visit Date: 08/17/2022 Requested by: Farrel Conners, [address] [place],  [place] [address] PCP: Farrel Conners, MD  Subjective: Chief Complaint  Patient presents with   Left Knee - Pain    HPI: Angelica Crosby is a 32 y.o. female who presents to the office reporting left knee pain.  Patient states that on February 12, she had insidious onset of left knee pain without any history of injury.  She got home from work and lay down and had difficulty putting full weight on her leg for 2 to 3 days following this.  She is now back closer to her baseline but still occasionally limping when she walks due to left knee pain.  She has new popping sensation in the left knee.  Localizes pain to the anteromedial and anterolateral aspects of the left knee.  No radiation of pain.  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  No prior surgery or any prior pain before this in her knee.  No significant medical history.  She does have a mother who has had prior total knee replacement.  Specifically denies any diabetes, smoking, blood thinner use.  No groin pain or any other joints bothering her..                ROS: All systems reviewed are negative as they relate to the chief complaint within the history of present illness.  Patient denies fevers or chills.  Assessment & Plan: Visit Diagnoses:  1. Synovitis of left knee   2. Left knee pain, unspecified chronicity     Plan: Patient is a 32 year old female who presents for evaluation of left knee pain.  She has atraumatic onset of left knee pain.  She feels it is swollen but there is no effusion on exam and she has radiographs taken today demonstrating no significant acute osseous abnormality.  She does have some medial and lateral joint line tenderness mildly that is worse with knee flexion.  We discussed options available to patient primarily  around injection versus MRI scan.  After discussion, plan to aspirate the left knee to see if there is any significant effusion; if there is a large effusion aspirated, plan to proceed with MRI without injection.  No effusion was able to be aspirated so left knee injection administered and patient tolerated procedure well.  She will follow-up with Dr. Marlou Sa in 6 weeks for clinical recheck but if she has continued symptoms in the form of popping or significant pain 2 weeks after the injection, recommended she reach out to the office for scheduling of left knee MRI scan  Follow-Up Instructions: No follow-ups on file.   Orders:  Orders Placed This Encounter  Procedures   XR KNEE 3 VIEW LEFT   No orders of the defined types were placed in this encounter.     Procedures: Large Joint Inj: L knee on 08/17/2022 4:54 PM Indications: diagnostic evaluation, joint swelling and pain Details: 18 G 1.5 in needle, superolateral approach  Arthrogram: No  Medications: 5 mL lidocaine 1 %; 40 mg methylPREDNISolone acetate 40 MG/ML; 4 mL bupivacaine 0.25 % Outcome: tolerated well, no immediate complications Procedure, treatment alternatives, risks and benefits explained, specific risks discussed. Consent was given by the patient. Immediately prior to procedure a time out was called  to verify the correct patient, procedure, equipment, support staff and site/side marked as required. Patient was prepped and draped in the usual sterile fashion.       Clinical Data: No additional findings.  Objective: Vital Signs: There were no vitals taken for this visit.  Physical Exam:  Constitutional: Patient appears well-developed HEENT:  Head: Normocephalic Eyes:EOM are normal Neck: Normal range of motion Cardiovascular: Normal rate Pulmonary/chest: Effort normal Neurologic: Patient is alert Skin: Skin is warm Psychiatric: Patient has normal mood and affect  Ortho Exam: Ortho exam demonstrates left knee with  no significant effusion.  She has mild tenderness over the medial joint line and the lateral joint line.  She has more moderate tenderness with the knee fully flexed to 120 degrees and then palpation over the lateral joint line.  She has mild pain with patellar side-to-side mobility.  No laxity noted.  Able to perform straight leg raise without extensor lag.  0 degrees extension.  There is no mechanical symptoms that is noted with passive motion of the knee.  No asymmetric crepitus.  No tenderness over the patellar tendon, patella, quadricep tendon.  Specialty Comments:  No specialty comments available.  Imaging: No results found.   PMFS History: Patient Active Problem List   Diagnosis Date Noted   Vitamin D deficiency 03/29/2022   Sickle cell trait ([facility])    Fatigue 11/23/2018   Irritable bowel 11/23/2018   Allergic rhinitis 11/19/2014   Mild intermittent asthma 11/19/2014   Past Medical History:  Diagnosis Date   Asthma    Eczema    Sickle cell trait ([facility])    Urticaria     Family History  Problem Relation Age of Onset   Sleep apnea Mother    High blood pressure Mother    Asthma Mother    Arthritis Mother    Allergies Father    Heart attack Maternal Grandmother    Heart attack Maternal Grandfather    Cancer Maternal Grandfather        ?renal   Healthy Paternal Grandmother    Alzheimer's disease Paternal Grandfather    ADD / ADHD Half-Brother    ADD / ADHD Half-Brother    Cerebral aneurysm Half-Brother    Breast cancer Maternal Aunt     Past Surgical History:  Procedure Laterality Date   HERNIA REPAIR     umbilical   OVARY SURGERY  08/2020   polyp removed   Social History   Occupational History   Not on file  Tobacco Use   Smoking status: Never    Passive exposure: Never   Smokeless tobacco: Never  Vaping Use   Vaping Use: Never used  Substance and Sexual Activity   Alcohol use: Yes    Alcohol/week: 0.0 standard drinks of alcohol    Comment: occasional    Drug use: No   Sexual activity: Yes    Birth control/protection: None

## 2022-09-28 ENCOUNTER — Ambulatory Visit: Payer: Federal, State, Local not specified - PPO | Admitting: Orthopedic Surgery

## 2022-09-28 ENCOUNTER — Encounter: Payer: Self-pay | Admitting: Orthopedic Surgery

## 2022-09-28 DIAGNOSIS — M659 Synovitis and tenosynovitis, unspecified: Secondary | ICD-10-CM | POA: Diagnosis not present

## 2022-09-28 DIAGNOSIS — M25562 Pain in left knee: Secondary | ICD-10-CM | POA: Diagnosis not present

## 2022-09-28 NOTE — Progress Notes (Signed)
Office Visit Note   Patient: Angelica Crosby           Date of Birth: 09/12/1990           MRN: [id number] Visit Date: 09/28/2022 Requested by: Karie Georges, MD [address] [place],  [place] [address] PCP: Karie Georges, MD  Subjective: Chief Complaint  Patient presents with   Left Knee - Pain, Follow-up    HPI: Angelica Crosby is a 32 y.o. female who presents to the office reporting left knee pain.  She had an injection 08/17/2022.  Doing okay.  Has good and bad days.  Still feels unstable and uncomfortable.  Reports some popping.  Describes lateral pain.  She works at the [employer]..                ROS: All systems reviewed are negative as they relate to the chief complaint within the history of present illness.  Patient denies fevers or chills.  Assessment & Plan: Visit Diagnoses:  1. Left knee pain, unspecified chronicity   2. Synovitis of left knee     Plan: Impression is fairly significant patellofemoral crepitus with effusion.  Having some lateral and superior pain.  Has failed conservative management.  Symptoms ongoing now for 2 to 3 months.  Injection helpful but not lasting.  Plan MRI scan left knee to evaluate meniscal tear versus severe patellofemoral arthritis and follow-up after that study  Follow-Up Instructions: No follow-ups on file.   Orders:  Orders Placed This Encounter  Procedures   MR Knee Left w/o contrast   No orders of the defined types were placed in this encounter.     Procedures: No procedures performed   Clinical Data: No additional findings.  Objective: Vital Signs: There were no vitals taken for this visit.  Physical Exam:  Constitutional: Patient appears well-developed HEENT:  Head: Normocephalic Eyes:EOM are normal Neck: Normal range of motion Cardiovascular: Normal rate Pulmonary/chest: Effort normal Neurologic: Patient is alert Skin: Skin is warm Psychiatric: Patient has normal mood and affect  Ortho  Exam: Ortho exam demonstrates slightly antalgic gait to the left.  Mild left knee effusion compared to no effusion on the right.  Patellofemoral crepitus is more on the left than the right.  Collateral cruciate ligaments are stable.  No masses lymphadenopathy or skin changes noted in that left leg region.  Does have slight tenderness over the superior lateral aspect of the patella.  No groin pain with internal/external Tat[place] of the leg.  Specialty Comments:  No specialty comments available.  Imaging: No results found.   PMFS History: Patient Active Problem List   Diagnosis Date Noted   Vitamin D deficiency 03/29/2022   Sickle cell trait    Fatigue 11/23/2018   Irritable bowel 11/23/2018   Allergic rhinitis 11/19/2014   Mild intermittent asthma 11/19/2014   Past Medical History:  Diagnosis Date   Asthma    Eczema    Sickle cell trait ([facility])    Urticaria     Family History  Problem Relation Age of Onset   Sleep apnea Mother    High blood pressure Mother    Asthma Mother    Arthritis Mother    Allergies Father    Heart attack Maternal Grandmother    Heart attack Maternal Grandfather    Cancer Maternal Grandfather        ?renal   Healthy Paternal Grandmother    Alzheimer's disease Paternal Grandfather    ADD /  ADHD Half-Brother    ADD / ADHD Half-Brother    Cerebral aneurysm Half-Brother    Breast cancer Maternal Aunt     Past Surgical History:  Procedure Laterality Date   HERNIA REPAIR     umbilical   OVARY SURGERY  08/2020   polyp removed   Social History   Occupational History   Not on file  Tobacco Use   Smoking status: Never    Passive exposure: Never   Smokeless tobacco: Never  Vaping Use   Vaping Use: Never used  Substance and Sexual Activity   Alcohol use: Yes    Alcohol/week: 0.0 standard drinks of alcohol    Comment: occasional   Drug use: No   Sexual activity: Yes    Birth control/protection: None

## 2022-10-04 ENCOUNTER — Other Ambulatory Visit: Payer: Self-pay | Admitting: Allergy

## 2022-10-13 ENCOUNTER — Ambulatory Visit
Admission: RE | Admit: 2022-10-13 | Discharge: 2022-10-13 | Disposition: A | Discharge status: Home / Self Care | Payer: Federal, State, Local not specified - PPO | Source: Ambulatory Visit | Attending: Orthopedic Surgery | Admitting: Orthopedic Surgery

## 2022-10-13 DIAGNOSIS — M25562 Pain in left knee: Secondary | ICD-10-CM | POA: Diagnosis not present

## 2022-10-13 DIAGNOSIS — M659 Synovitis and tenosynovitis, unspecified: Secondary | ICD-10-CM

## 2022-10-19 ENCOUNTER — Telehealth: Payer: Self-pay

## 2022-10-19 ENCOUNTER — Ambulatory Visit: Payer: Federal, State, Local not specified - PPO | Admitting: Orthopedic Surgery

## 2022-10-19 DIAGNOSIS — M1712 Unilateral primary osteoarthritis, left knee: Secondary | ICD-10-CM | POA: Diagnosis not present

## 2022-10-19 NOTE — Telephone Encounter (Signed)
Auth needed for left knee gel 

## 2022-10-21 ENCOUNTER — Encounter: Payer: Self-pay | Admitting: Orthopedic Surgery

## 2022-10-21 NOTE — Progress Notes (Unsigned)
Office Visit Note   Patient: Angelica Crosby           Date of Birth: Sep 27, 1990           MRN: [id number] Visit Date: 10/19/2022 Requested by: Karie Georges, MD [address] [place],  [place] [address] PCP: Karie Georges, MD  Subjective: Chief Complaint  Patient presents with   Left Knee - Pain, Follow-up    MRI review    HPI: Angelica Crosby is a 32 y.o. female who presents to the office reporting left knee pain.  Since he was last seen she has had an MRI scan of the knee.  That is reviewed with her today.  She localizes the pain to the superior lateral aspect of the patella region.  MRI scan shows partial-thickness cartilage loss of the trochlear groove and medial trochlea.  She has had an injection which wore off.  She lives in a third floor apartment.  No history of patellar instability..                ROS: All systems reviewed are negative as they relate to the chief complaint within the history of present illness.  Patient denies fevers or chills.  Assessment & Plan: Visit Diagnoses: No diagnosis found.  Plan: Impression is partial-thickness cartilage loss in the patellofemoral joint.  Exam is much more significant than MRI findings.  Definitely asymmetric left knee with no real palpable symptoms in the right knee.  Injection helped but wore off.  I do not really see an operative problem which is correctable with arthroscopy at this time.  I think gel injection would be a logical next step. This patient is diagnosed with osteoarthritis of the knee(s).    Radiographs show evidence of joint space narrowing, osteophytes, subchondral sclerosis and/or subchondral cysts.  This patient has knee pain which interferes with functional and activities of daily living.    This patient has experienced inadequate response, adverse effects and/or intolerance with conservative treatments such as acetaminophen, NSAIDS, topical creams, physical therapy or regular exercise, knee  bracing and/or weight loss.   This patient has experienced inadequate response or has a contraindication to intra articular steroid injections for at least 3 months.   This patient is not scheduled to have a total knee replacement within 6 months of starting treatment with viscosupplementation.   Follow-Up Instructions: No follow-ups on file.   Orders:  No orders of the defined types were placed in this encounter.  No orders of the defined types were placed in this encounter.     Procedures: No procedures performed   Clinical Data: No additional findings.  Objective: Vital Signs: There were no vitals taken for this visit.  Physical Exam:  Constitutional: Patient appears well-developed HEENT:  Head: Normocephalic Eyes:EOM are normal Neck: Normal range of motion Cardiovascular: Normal rate Pulmonary/chest: Effort normal Neurologic: Patient is alert Skin: Skin is warm Psychiatric: Patient has normal mood and affect  Ortho Exam: Ortho exam demonstrates patellofemoral crepitus on the left but not on the right.  No effusion today.  Collateral and cruciate ligaments are stable.  No joint line tenderness and with negative McMurray compression testing in both knees.  Gait is normal.  Specialty Comments:  No specialty comments available.  Imaging: No results found.   PMFS History: Patient Active Problem List   Diagnosis Date Noted   Vitamin D deficiency 03/29/2022   Sickle cell trait (HCC)    Fatigue 11/23/2018   Irritable bowel  11/23/2018   Allergic rhinitis 11/19/2014   Mild intermittent asthma 11/19/2014   Past Medical History:  Diagnosis Date   Asthma    Eczema    Sickle cell trait (HCC)    Urticaria     Family History  Problem Relation Age of Onset   Sleep apnea Mother    High blood pressure Mother    Asthma Mother    Arthritis Mother    Allergies Father    Heart attack Maternal Grandmother    Heart attack Maternal Grandfather    Cancer Maternal  Grandfather        ?renal   Healthy Paternal Grandmother    Alzheimer's disease Paternal Grandfather    ADD / ADHD Half-Brother    ADD / ADHD Half-Brother    Cerebral aneurysm Half-Brother    Breast cancer Maternal Aunt     Past Surgical History:  Procedure Laterality Date   HERNIA REPAIR     umbilical   OVARY SURGERY  08/2020   polyp removed   Social History   Occupational History   Not on file  Tobacco Use   Smoking status: Never    Passive exposure: Never   Smokeless tobacco: Never  Vaping Use   Vaping Use: Never used  Substance and Sexual Activity   Alcohol use: Yes    Alcohol/week: 0.0 standard drinks of alcohol    Comment: occasional   Drug use: No   Sexual activity: Yes    Birth control/protection: None

## 2022-10-21 NOTE — Telephone Encounter (Signed)
VOB submitted for Supartz Fx, left knee.  

## 2022-10-28 ENCOUNTER — Encounter: Payer: Self-pay | Admitting: Orthopedic Surgery

## 2022-11-03 ENCOUNTER — Telehealth: Payer: Self-pay | Admitting: Orthopedic Surgery

## 2022-11-03 NOTE — Telephone Encounter (Signed)
Patient asking to have every thing about her knee typed up without showing any other medical conditions for her job. Please advise patient when done

## 2022-11-03 NOTE — Telephone Encounter (Signed)
I have sent mychart message to patient as well.  Tried calling, need additional information for what patient is requesting.

## 2022-11-09 ENCOUNTER — Encounter: Payer: Self-pay | Admitting: Allergy

## 2022-11-09 ENCOUNTER — Other Ambulatory Visit: Payer: Self-pay

## 2022-11-09 ENCOUNTER — Ambulatory Visit: Payer: Federal, State, Local not specified - PPO | Admitting: Allergy

## 2022-11-09 VITALS — BP 112/82 | HR 91 | Temp 98.0°F | Resp 16

## 2022-11-09 DIAGNOSIS — L2089 Other atopic dermatitis: Secondary | ICD-10-CM

## 2022-11-09 DIAGNOSIS — H1013 Acute atopic conjunctivitis, bilateral: Secondary | ICD-10-CM

## 2022-11-09 DIAGNOSIS — J4520 Mild intermittent asthma, uncomplicated: Secondary | ICD-10-CM | POA: Diagnosis not present

## 2022-11-09 DIAGNOSIS — T781XXD Other adverse food reactions, not elsewhere classified, subsequent encounter: Secondary | ICD-10-CM | POA: Diagnosis not present

## 2022-11-09 DIAGNOSIS — J3089 Other allergic rhinitis: Secondary | ICD-10-CM

## 2022-11-09 MED ORDER — ALBUTEROL SULFATE HFA 108 (90 BASE) MCG/ACT IN AERS: 2.0000 | INHALATION_SPRAY | RESPIRATORY_TRACT | 1 refills | Status: AC | PRN

## 2022-11-09 MED ORDER — FLUTICASONE PROPIONATE HFA 110 MCG/ACT IN AERO: 2.0000 | INHALATION_SPRAY | Freq: Two times a day (BID) | RESPIRATORY_TRACT | 1 refills | Status: DC

## 2022-11-09 MED ORDER — RYALTRIS 665-25 MCG/ACT NA SUSP: NASAL | 5 refills | Status: AC

## 2022-11-09 NOTE — Progress Notes (Unsigned)
Follow-up Note  RE: Angelica Crosby MRN: [id number] DOB: 12-03-1990 Date of Office Visit: 11/09/2022   History of present illness: Angelica Crosby is a 32 y.o. female presenting today for follow-up of allergic rhinitis with conjunctivitis with oral allergy syndrome, asthma and atopic dermatitis. She was last seen in the office on 05/11/22 by myself.  She has not had any major health changes, surgeries or hospitalizations since her last visit.   She feels like the xyzal is too strong and she wakes up groggy.  She will use the xyzal on her off work days.  Ryaltris is still helpful for her.  She has not needed to use eye drop.  Her asthma seems to be doing quite well as well.  She has not had any ED or urgent care visits or any systemic steroid needs.  She tries to use her flovent daily when she remembers.   She has used albuterol twice in May which is average amount of use for her mostly for wheezing. Pollen and dust are triggers.   She states her skin has been doing quite well without any flares or need to use her topical steroid.  Review of systems: Review of Systems  Constitutional: Negative.   HENT: Negative.    Eyes: Negative.   Respiratory:  Positive for wheezing.   Cardiovascular: Negative.   Gastrointestinal: Negative.   Musculoskeletal: Negative.   Skin: Negative.   Allergic/Immunologic: Negative.   Neurological: Negative.      All other systems negative unless noted above in HPI  Past medical/social/surgical/family history have been reviewed and are unchanged unless specifically indicated below.  No changes  Medication List: Current Outpatient Medications  Medication Sig Dispense Refill   Ascorbic Acid (VITAMIN C) 1000 MG tablet 1 tablet     cholecalciferol (VITAMIN D3) 25 MCG (1000 UNIT) tablet 1 tablet     diphenhydrAMINE (BENADRYL) 25 MG tablet 1 tablet at bedtime as needed     [facility] 1/35 1-35 MG-MCG tablet Take 1 tablet by mouth daily.     levocetirizine  (XYZAL) 5 MG tablet TAKE 1 TABLET(5 MG) BY MOUTH EVERY EVENING 30 tablet 5   Omeprazole 20 MG TBDD 1 capsule 30 minutes before morning meal     albuterol (VENTOLIN HFA) 108 (90 Base) MCG/ACT inhaler Inhale 2 puffs into the lungs as needed for wheezing or shortness of breath. 18 g 1   fluticasone (FLOVENT HFA) 110 MCG/ACT inhaler Inhale 2 puffs into the lungs in the morning and at bedtime. 36 g 1   Olopatadine-Mometasone (RYALTRIS) 665-25 MCG/ACT SUSP Place 2 sprays per nostril 1-2 times daily as needed for nasal congestion or drainage contro 29 g 5   No current facility-administered medications for this visit.     Known medication allergies: Allergies  Allergen Reactions   Amoxicillin Hives and Itching   Iodinated Contrast Media Nausea And Vomiting   Other Other (See Comments)    Strawberries cause discoloration of tongue and pineapple juice cause a tingling sensation in the lips, also allergic to pollenated foods, trees, grass and dogs   Penicillins Hives and Itching    Has patient had a PCN reaction causing immediate rash, facial/tongue/throat swelling, SOB or lightheadedness with hypotension: no Has patient had a PCN reaction causing severe rash involving mucus membranes or skin necrosis: No Has patient had a PCN reaction that required hospitalization: no  Has patient had a PCN reaction occurring within the last 10 years: yes If all of the above answers  are "NO", then may proceed with Cephalosporin use.     Physical examination: Blood pressure 112/82, pulse 91, temperature 98 F (36.7 C), temperature source Temporal, resp. rate 16, SpO2 98 %.  General: Alert, interactive, in no acute distress. HEENT: PERRLA, TMs pearly gray, turbinates non-edematous without discharge, post-pharynx non erythematous. Neck: Supple without lymphadenopathy. Lungs: Clear to auscultation without wheezing, rhonchi or rales. {no increased work of breathing. CV: Normal S1, S2 without murmurs. Abdomen:  Nondistended, nontender. Skin: Warm and dry, without lesions or rashes. Extremities:  No clubbing, cyanosis or edema. Neuro:   Grossly intact.  Diagnositics/Labs: None today  Assessment and plan: Allergic rhinitis with conjunctivitis - Continue avoidance measures for grasses, trees, outdoor molds, and dog. - Start use of Allegra 180mg  daily.  Sample provide to see if tolerates use - Continue Ryaltris (olopatadine/mometasone) two sprays per nostril 1-2 times daily as needed for nasal congestion or drainage control Pataday (olopatadine) one drop per eye daily as needed for itchy, watery eyes - You can use an extra dose of the antihistamine, if needed, for breakthrough symptoms.  - Consider nasal saline rinses 1-2 times daily to remove allergens from the nasal cavities as well as help with mucous clearance (this is especially helpful to do before the nasal sprays are given) - Consider allergy shots as a means of long-term control. - Allergy shots "re-train" and "reset" the immune system to ignore environmental allergens and decrease the resulting immune response to those allergens (sneezing, itchy watery eyes, runny nose, nasal congestion, etc).    - Allergy shots improve symptoms in 75-85% of patients.   Oral allergy syndrome - Food testing for strawberry and pineapple was negative thus this confirms OAS - The oral allergy syndrome (OAS) or pollen-food allergy syndrome (PFAS) is a relatively common form of food allergy, particularly in adults. It typically occurs in people who have pollen allergies when the immune system "sees" proteins on the food that look like proteins on the pollen. This results in the allergy antibody (IgE) binding to the food instead of the pollen. Patients typically report itching and/or mild swelling of the mouth and throat immediately following ingestion of certain uncooked fruits (including nuts) or raw vegetables. Only a very small number of affected individuals  experience systemic allergic reactions, such as anaphylaxis which occurs with true food allergies.    Mild intermittent asthma - Continue Flovent 2 puffs twice a day with spacer device.   Spacer provided today - Have access to albuterol inhaler 2 puffs every 4-6 hours as needed for cough/wheeze/shortness of breath/chest tightness.  May use 15-20 minutes prior to activity.   Monitor frequency of use.    Asthma control goals:  Full participation in all desired activities (may need albuterol before activity) Albuterol use two time or less a week on average (not counting use with activity) Cough interfering with sleep two time or less a month Oral steroids no more than once a year No hospitalizations  Atopic dermatitis - Bathe and soak for 5-10 minutes in warm water once a day. Pat dry.  Immediately apply the below cream prescribed to flared areas (red, irritated, dry, itchy, patchy, scaly, flaky) only. Wait several minutes and then apply your moisturizer all over.    To affected areas on the body apply: Hydrocortisone 1% cream twice a day as needed.   - Keep finger nails trimmed.  Follow-up in 6 months or sooner if needed  I appreciate the opportunity to take part in [place] care. Please  do not hesitate to contact me with questions.  Sincerely,   Margo Aye, MD Allergy/Immunology [facility]

## 2022-11-09 NOTE — Patient Instructions (Addendum)
-   Continue avoidance measures for grasses, trees, outdoor molds, and dog. - Start use of Allegra 180mg  daily.  Sample provide to see if tolerates use - Continue Ryaltris (olopatadine/mometasone) two sprays per nostril 1-2 times daily as needed for nasal congestion or drainage control Pataday (olopatadine) one drop per eye daily as needed for itchy, watery eyes - You can use an extra dose of the antihistamine, if needed, for breakthrough symptoms.  - Consider nasal saline rinses 1-2 times daily to remove allergens from the nasal cavities as well as help with mucous clearance (this is especially helpful to do before the nasal sprays are given) - Consider allergy shots as a means of long-term control. - Allergy shots "re-train" and "reset" the immune system to ignore environmental allergens and decrease the resulting immune response to those allergens (sneezing, itchy watery eyes, runny nose, nasal congestion, etc).    - Allergy shots improve symptoms in 75-85% of patients.   - Food testing for strawberry and pineapple is negative thus this confirms OAS - The oral allergy syndrome (OAS) or pollen-food allergy syndrome (PFAS) is a relatively common form of food allergy, particularly in adults. It typically occurs in people who have pollen allergies when the immune system "sees" proteins on the food that look like proteins on the pollen. This results in the allergy antibody (IgE) binding to the food instead of the pollen. Patients typically report itching and/or mild swelling of the mouth and throat immediately following ingestion of certain uncooked fruits (including nuts) or raw vegetables. Only a very small number of affected individuals experience systemic allergic reactions, such as anaphylaxis which occurs with true food allergies.    - Continue Flovent 2 puffs twice a day with spacer device.   Spacer provided today - Have access to albuterol inhaler 2 puffs every 4-6 hours as needed for  cough/wheeze/shortness of breath/chest tightness.  May use 15-20 minutes prior to activity.   Monitor frequency of use.    Asthma control goals:  Full participation in all desired activities (may need albuterol before activity) Albuterol use two time or less a week on average (not counting use with activity) Cough interfering with sleep two time or less a month Oral steroids no more than once a year No hospitalizations  - Bathe and soak for 5-10 minutes in warm water once a day. Pat dry.  Immediately apply the below cream prescribed to flared areas (red, irritated, dry, itchy, patchy, scaly, flaky) only. Wait several minutes and then apply your moisturizer all over.    To affected areas on the body apply: Hydrocortisone 1% cream twice a day as needed.   - Keep finger nails trimmed.  Follow-up in 6 months or sooner if needed

## 2022-11-23 ENCOUNTER — Other Ambulatory Visit (HOSPITAL_COMMUNITY)
Admission: RE | Admit: 2022-11-23 | Discharge: 2022-11-23 | Disposition: A | Discharge status: Home / Self Care | Payer: Federal, State, Local not specified - PPO | Source: Ambulatory Visit | Attending: Family Medicine | Admitting: Family Medicine

## 2022-11-23 ENCOUNTER — Encounter: Payer: Self-pay | Admitting: Family Medicine

## 2022-11-23 ENCOUNTER — Ambulatory Visit: Payer: Federal, State, Local not specified - PPO | Admitting: Family Medicine

## 2022-11-23 VITALS — BP 118/82 | HR 75 | Temp 98.3°F | Ht 62.0 in | Wt 281.4 lb

## 2022-11-23 DIAGNOSIS — E559 Vitamin D deficiency, unspecified: Secondary | ICD-10-CM

## 2022-11-23 DIAGNOSIS — B9689 Other specified bacterial agents as the cause of diseases classified elsewhere: Secondary | ICD-10-CM

## 2022-11-23 DIAGNOSIS — N898 Other specified noninflammatory disorders of vagina: Secondary | ICD-10-CM | POA: Diagnosis not present

## 2022-11-23 DIAGNOSIS — N760 Acute vaginitis: Secondary | ICD-10-CM | POA: Diagnosis not present

## 2022-11-23 DIAGNOSIS — A5901 Trichomonal vulvovaginitis: Secondary | ICD-10-CM | POA: Insufficient documentation

## 2022-11-23 MED ORDER — SAXENDA 18 MG/3ML ~~LOC~~ SOPN
PEN_INJECTOR | SUBCUTANEOUS | 3 refills | Status: DC
Start: 2022-11-23 — End: 2022-11-23

## 2022-11-23 MED ORDER — SAXENDA 18 MG/3ML ~~LOC~~ SOPN
PEN_INJECTOR | SUBCUTANEOUS | 0 refills | Status: DC
Start: 2022-11-23 — End: 2022-12-26

## 2022-11-23 NOTE — Patient Instructions (Addendum)
Total calorie intake:  200 calories per day  Total protein: 120 gram per day  Total carbs per day: 100 grams per day  Fiber: 35 grams per day

## 2022-11-23 NOTE — Assessment & Plan Note (Signed)
I have had an extensive 30 minute conversation today with the patient about healthy eating habits, exercise, calorie and carb goals for sustainable and successful weight loss. I gave the patient caloric and protein daily intake values as well as described the importance of increasing fiber and water intake. I discussed weight loss medications that could be used in the treatment of this patient. Handouts on low carb eating were given to the patient.    Patient has tried diet and exercise for at least 6 months without any weight loss.   Comorbid condition of knee pain requiring injection, I gave her calorie, carb and protein goals listed below. I will continue to monitor her weight loss and progress   Total calorie intake:  200 calories per day  Total protein: 120 gram per day  Total carbs per day: 100 grams per day  Fiber: 35 grams per day

## 2022-11-23 NOTE — Assessment & Plan Note (Signed)
Taking her daily supplements, will obtain new level for surveillance

## 2022-11-23 NOTE — Assessment & Plan Note (Signed)
Checking Nuswab today to rule out pathologic infection

## 2022-11-23 NOTE — Progress Notes (Unsigned)
Established Patient Office Visit  Subjective   Patient ID: Angelica Crosby, female    DOB: 19-Aug-1990  Age: 32 y.o. MRN: [id number]  Chief Complaint  Patient presents with   Obesity    Patient complains of increased weight gain   Vaginal Discharge    Patient complains of brown discharge and abnormal odor intermittently x2 months, wears pantiliners    Patient states that she is still having difficulty losing weight. She has been cutting her calories and exercising for the last 6 months since I saw her in October. States that she has cut out a lot of extra calories and has been treadmill, walking, at least 1-2 hours per week, light weights without any weight loss.   Patient states she injured her knee at work and is seeing ortho for this. Has pain when bearing weight on the knee.  Pt also reports she has been diagnosed recently with ADHD, is hesitant to try medication for this.   Vaginal Discharge The patient's primary symptoms include vaginal discharge. This is a recurrent problem. The current episode started more than 1 month ago. The problem has been waxing and waning. The patient is experiencing no pain. Pertinent negatives include no discolored urine, fever or rash. The vaginal discharge was brown, malodorous, thick and white. There has been no bleeding. She has not been passing clots. She has not been passing tissue. Nothing aggravates the symptoms. She has tried nothing for the symptoms. She is sexually active. No, her partner does not have an STD.    Current Outpatient Medications  Medication Instructions   albuterol (VENTOLIN HFA) 108 (90 Base) MCG/ACT inhaler 2 puffs, Inhalation, As needed   Ascorbic Acid (VITAMIN C) 1000 MG tablet 1 tablet   cholecalciferol (VITAMIN D3) 25 MCG (1000 UNIT) tablet 1 tablet   diphenhydrAMINE (BENADRYL) 25 MG tablet 1 tablet at bedtime as needed   fluticasone (FLOVENT HFA) 110 MCG/ACT inhaler 2 puffs, Inhalation, 2 times daily   [facility] 1/35 1-35  MG-MCG tablet 1 tablet, Oral, Daily   levocetirizine (XYZAL) 5 MG tablet TAKE 1 TABLET(5 MG) BY MOUTH EVERY EVENING   Liraglutide -Weight Management (SAXENDA) 18 MG/3ML SOPN Inject 0.6 mg into the skin daily for 7 days, THEN 1.2 mg daily for 7 days, THEN 1.8 mg daily for 7 days, THEN 2.4 mg daily for 7 days, THEN 3 mg daily.   Olopatadine-Mometasone (RYALTRIS) X543819 MCG/ACT SUSP Place 2 sprays per nostril 1-2 times daily as needed for nasal congestion or drainage contro   Omeprazole 20 MG TBDD 1 capsule 30 minutes before morning meal    {History (Optional):23778}  Review of Systems  Constitutional:  Negative for fever.  Genitourinary:  Positive for vaginal discharge.  Skin:  Negative for rash.      Objective:     BP 118/82 (BP Location: Left Arm, Patient Position: Sitting, Cuff Size: Large)   Pulse 75   Temp 98.3 F (36.8 C) (Oral)   Ht 5\' 2"  (1.575 m)   Wt 281 lb 6.4 oz (127.6 kg)   LMP 11/18/2022 (Exact Date)   SpO2 97%   BMI 51.47 kg/m  {Vitals History (Optional):23777}  Physical Exam Constitutional:      Appearance: Normal appearance. She is morbidly obese. She is not ill-appearing.  HENT:     Mouth/Throat:     Mouth: Mucous membranes are moist.     Pharynx: No posterior oropharyngeal erythema.  Eyes:     Conjunctiva/sclera: Conjunctivae normal.  Pulmonary:  Effort: Pulmonary effort is normal.  Neurological:     General: No focal deficit present.     Mental Status: She is alert and oriented to person, place, and time.  Psychiatric:        Mood and Affect: Mood normal.        Behavior: Behavior normal.        Thought Content: Thought content normal.        Judgment: Judgment normal.      No results found for any visits on 11/23/22.  {Labs (Optional):23779}  The ASCVD Risk score (Arnett DK, et al., 2019) failed to calculate for the following reasons:   The 2019 ASCVD risk score is only valid for ages 92 to 73    Assessment & Plan:  Vitamin D  deficiency Assessment & Plan: Taking her daily supplements, will obtain new level for surveillance  Orders: -     VITAMIN D 25 Hydroxy (Vit-D Deficiency, Fractures)  Morbid obesity (HCC) Assessment & Plan: I have had an extensive 30 minute conversation today with the patient about healthy eating habits, exercise, calorie and carb goals for sustainable and successful weight loss. I gave the patient caloric and protein daily intake values as well as described the importance of increasing fiber and water intake. I discussed weight loss medications that could be used in the treatment of this patient. Handouts on low carb eating were given to the patient.    Patient has tried diet and exercise for at least 6 months without any weight loss.   Comorbid condition of knee pain requiring injection, I gave her calorie, carb and protein goals listed below. I will continue to monitor her weight loss and progress   Total calorie intake:  200 calories per day  Total protein: 120 gram per day  Total carbs per day: 100 grams per day  Fiber: 35 grams per day  Orders: -     Saxenda; Inject 0.6 mg into the skin daily for 7 days, THEN 1.2 mg daily for 7 days, THEN 1.8 mg daily for 7 days, THEN 2.4 mg daily for 7 days, THEN 3 mg daily.  Dispense: 45 mL; Refill: 3  Vaginal discharge Assessment & Plan: Checking Nuswab today to rule out pathologic infection  Orders: -     Cervicovaginal ancillary only     Return in about 3 months (around 02/23/2023) for weight loss.    Karie Georges, MD

## 2022-11-24 LAB — CERVICOVAGINAL ANCILLARY ONLY
Bacterial Vaginitis (gardnerella): POSITIVE — AB
Candida Glabrata: NEGATIVE
Candida Vaginitis: NEGATIVE
Chlamydia: NEGATIVE
Comment: NEGATIVE
Comment: NEGATIVE
Comment: NEGATIVE
Comment: NEGATIVE
Comment: NEGATIVE
Comment: NORMAL
Neisseria Gonorrhea: NEGATIVE
Trichomonas: POSITIVE — AB

## 2022-11-24 MED ORDER — METRONIDAZOLE 500 MG PO TABS
500.0000 mg | ORAL_TABLET | Freq: Two times a day (BID) | ORAL | 0 refills | Status: AC
Start: 2022-11-24 — End: 2022-12-01

## 2022-11-24 NOTE — Addendum Note (Signed)
Addended by: Karie Georges on: 11/24/2022 03:14 PM   Modules accepted: Orders

## 2022-11-29 ENCOUNTER — Telehealth: Payer: Self-pay | Admitting: Orthopedic Surgery

## 2022-11-29 DIAGNOSIS — M1712 Unilateral primary osteoarthritis, left knee: Secondary | ICD-10-CM

## 2022-11-29 DIAGNOSIS — M25562 Pain in left knee: Secondary | ICD-10-CM

## 2022-11-29 NOTE — Telephone Encounter (Signed)
Patient would like to know if she can try PT for her knee while she is waiting to get approval for a gel injection, also she is trying to move from the 3rd floor to the 1st floor at her apt complex and the office would like a letter stating her condition with her knee and her trouble she is having with stairs send via fax [phone]

## 2022-11-30 NOTE — Telephone Encounter (Signed)
Okay for PT upstairs or at place patient prefers.  We can give her a note stating basically she has arthritis advanced for age in the knee and moving to a 1st floor apartment and avoiding stairs would be beneficial to her overall function

## 2022-11-30 NOTE — Addendum Note (Signed)
Addended by: Barbette Or on: 11/30/2022 02:13 PM   Modules accepted: Orders

## 2022-11-30 NOTE — Telephone Encounter (Signed)
Note done and faxed and referral placed in chart. Pt was called and advised

## 2022-12-13 ENCOUNTER — Encounter: Payer: Self-pay | Admitting: Family Medicine

## 2022-12-26 ENCOUNTER — Other Ambulatory Visit: Payer: Self-pay

## 2022-12-26 MED ORDER — SAXENDA 18 MG/3ML ~~LOC~~ SOPN
PEN_INJECTOR | SUBCUTANEOUS | 0 refills | Status: AC
Start: 2022-12-26 — End: 2023-02-22
  Filled 2022-12-26: qty 15, 44d supply, fill #0

## 2023-01-03 NOTE — Therapy (Signed)
OUTPATIENT PHYSICAL THERAPY LOWER EXTREMITY EVALUATION   Patient Name: Angelica Crosby MRN: [id number] DOB:Sep 13, 1990, 32 y.o., female Today's Date: 01/04/2023  END OF SESSION:  PT End of Session - 01/04/23 0928     Visit Number 1    Date for PT Re-Evaluation 02/15/23    Authorization Type BCBS    PT Start Time 0930    PT Stop Time 1010    PT Time Calculation (min) 40 min    Activity Tolerance Patient tolerated treatment well    Behavior During Therapy WFL for tasks assessed/performed             Past Medical History:  Diagnosis Date   Asthma    Eczema    Sickle cell trait (HCC)    Urticaria    Past Surgical History:  Procedure Laterality Date   HERNIA REPAIR     umbilical   OVARY SURGERY  08/2020   polyp removed   Patient Active Problem List   Diagnosis Date Noted   Morbid obesity (HCC) 11/23/2022   Vaginal discharge 11/23/2022   Vitamin D deficiency 03/29/2022   Sickle cell trait (HCC)    Fatigue 11/23/2018   Irritable bowel 11/23/2018   Allergic rhinitis 11/19/2014   Mild intermittent asthma 11/19/2014    PCP: Angelica Georges, MD   REFERRING PROVIDER: Julieanne Cotton, PA-C   REFERRING DIAG:  [phone] (ICD-10-CM) - Left knee pain, unspecified chronicity  M17.12 (ICD-10-CM) - Patellofemoral arthritis of left knee    THERAPY DIAG:  Acute pain of left knee  Muscle weakness (generalized)  Other abnormalities of gait and mobility  Cramp and spasm  Rationale for Evaluation and Treatment: Rehabilitation  ONSET DATE: February 2024  SUBJECTIVE:   SUBJECTIVE STATEMENT: 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 Then came home and climbed 3 flights of stairs. When she went to get up after lying down she couldn't get up for 2 days. Presently she has a burning feeling and sometimes her knee buckles. Starts to feel after 4-5 hours. Had one cortisone shots about 2 months ago. Looking to get gel injection if  insurance approves it.  PERTINENT HISTORY:  Patellofemoral OA PAIN:  Are you having pain? Yes: NPRS scale: 0 up to 7-8/10 Pain location: left knee Pain description: burning Aggravating factors: standing/walking/stairs Relieving factors: rest  PRECAUTIONS: None  RED FLAGS: None   WEIGHT BEARING RESTRICTIONS: No  FALLS:  Has patient fallen in last 6 months? No  LIVING ENVIRONMENT: Lives with: lives alone Lives in: House/apartment Stairs: Yes: External: 35 steps; can reach both Has following equipment at home: None  OCCUPATION: [employer]  PLOF: Independent  PATIENT GOALS: get my knee back to normal, for it to feel stable  NEXT MD VISIT: none scheduled  OBJECTIVE:   DIAGNOSTIC FINDINGS: MRI 10/17/22 IMPRESSION: 1. No meniscal or ligamentous injury of the left knee. 2. Partial-thickness cartilage loss of the trochlear groove of and medial trochlea.  PATIENT SURVEYS:  LEFS 44 / 80 = 55.0 %  COGNITION: Overall cognitive status: Within functional limits for tasks assessed     SENSATION: Some tingling laterally  MUSCLE LENGTH: R HS and piriformis tightness, L WNL, quads WNL  POSTURE: increased lumbar lordosis and B hip IR   PALPATION: Patellar glides laterally B; no pain when patellar blocked  LOWER EXTREMITY ROM:  Active ROM Right eval Left eval  Hip flexion    Hip extension    Hip abduction  Hip adduction    Hip internal rotation    Hip external rotation    Knee flexion 122 125  Knee extension 3 hyperext 3 hyperext  Ankle dorsiflexion    Ankle plantarflexion    Ankle inversion    Ankle eversion     (Blank rows = not tested)  LOWER EXTREMITY MMT:  MMT Right eval Left eval  Hip flexion 5 5  Hip extension 5 5  Hip abduction 5 5  Hip adduction  5  Hip internal rotation    Hip external rotation    Knee flexion 4+ 4+  Knee extension 5 5  Ankle dorsiflexion 5 5  Ankle plantarflexion    Ankle inversion    Ankle eversion      (Blank rows = not tested)  LOWER EXTREMITY SPECIAL TESTS:  Knee special tests: Lateral pull sign: positive   FUNCTIONAL TESTS:  5 times sit to stand: 20.5 sec Step ups WNL Step downs - eccentric weakness Stairs - ecentric weakness B, increase trunk lean left  and forward with ascending  GAIT: Distance walked: 60 Assistive device utilized: None Level of assistance: Complete Independence Comments: B hip IR   TODAY'S TREATMENT:                                                                                                                              DATE:   01/04/23  See pt ed and HEP  PATIENT EDUCATION:  Education details: PT eval findings, anticipated POC, initial HEP, and role of DN  Person educated: Patient Education method: Explanation, Demonstration, and Handouts Education comprehension: verbalized understanding and returned demonstration  HOME EXERCISE PROGRAM: Access Code: [email or web address] URL: [email or web address] Date: 01/04/2023 Prepared by: Angelica Crosby  Exercises - Supine Quad Set  - 1 x daily - 7 x weekly - 2-3 sets - 10 reps - 5 sec hold - Supine Knee Extension Strengthening  - 1 x daily - 7 x weekly - 2-3 sets - 10 reps - 5 sec hold - Supine Active Straight Leg Raise (Mirrored)  - 1 x daily - 7 x weekly - 3 sets - 10 reps - Straight Leg Raise with External Rotation  - 1 x daily - 7 x weekly - 3 sets - 10 reps  ASSESSMENT:  CLINICAL IMPRESSION: Angelica Crosby is a 32 y.o. female who was seen today for physical therapy evaluation and treatment for left knee pain of insidious onset in February 2024. She also reports that her knee gives way causing her to use a step-to gait on stairs. She demonstrates normal ROM and mild strength deficits in knee flex with MMT, but does demonstrate functional quad weakness with stairs. She has tightness in her R hip, B HS and increased tone and trigger points in B quads. Her patellae glide laterally with quad contraction and she  has crepitis in the left knee with both flexion and extension. She reports decreased pain in L knee with patella glided  medially and may benefit from trial of tape. Angelica Crosby will benefit from skilled PT to address these deficits.    OBJECTIVE IMPAIRMENTS: decreased activity tolerance, decreased strength, increased muscle spasms, impaired flexibility, postural dysfunction, and pain.   ACTIVITY LIMITATIONS: standing, stairs, and locomotion level  PARTICIPATION LIMITATIONS:  pt works through pain  PERSONAL FACTORS: Fitness and Time since onset of injury/illness/exacerbation are also affecting patient's functional outcome.   REHAB POTENTIAL: Good  CLINICAL DECISION MAKING: Stable/uncomplicated  EVALUATION COMPLEXITY: Low   SHORT TERM GOALS: Target date: 01/18/2023    Ind with initial HEP Baseline: Goal status: INITIAL   LONG TERM GOALS: Target date: 02/15/2023   Ind with advanced HEP and its progression Baseline:  Goal status: INITIAL  2. Decreased pain with standing and walking by 75% or more to improve QOL. Baseline:  Goal status: INITIAL  3.  Improved 5xSTS to 15 sec or less demonstrating improved functional strength. Baseline: 20 sec Goal status: INITIAL  4.  Able to climb stairs with a reciprocal gait pattern and good eccentric quad control. Baseline:  Goal status: INITIAL  5.  Improved LEFS to 53/80 showing functional improvement. Baseline: 44 / 80 = 55.0 % Goal status: INITIAL   PLAN:  PT FREQUENCY: 2x/week  PT DURATION: 6 weeks  PLANNED INTERVENTIONS: Therapeutic exercises, Therapeutic activity, Neuromuscular re-education, Balance training, Gait training, Patient/Family education, Self Care, Joint mobilization, Stair training, Aquatic Therapy, Dry Needling, Electrical stimulation, Cryotherapy, Moist heat, Taping, Vasopneumatic device, Ionotophoresis 4mg /ml Dexamethasone, and Manual therapy  PLAN FOR NEXT SESSION: Review and progress HEP, work on knee strength  especially eccentric; DN/MT to left quads; possible taping for medial glide, stairs  Solon Palm, PT 01/04/2023, 10:12 AM [facility] [address] [place], [place] [address] [phone]  Fax: [phone]

## 2023-01-04 ENCOUNTER — Encounter: Payer: Self-pay | Admitting: Physical Therapy

## 2023-01-04 ENCOUNTER — Other Ambulatory Visit: Payer: Self-pay

## 2023-01-04 ENCOUNTER — Ambulatory Visit: Payer: Federal, State, Local not specified - PPO | Attending: Surgical | Admitting: Physical Therapy

## 2023-01-04 DIAGNOSIS — R2689 Other abnormalities of gait and mobility: Secondary | ICD-10-CM | POA: Insufficient documentation

## 2023-01-04 DIAGNOSIS — M25562 Pain in left knee: Secondary | ICD-10-CM | POA: Diagnosis not present

## 2023-01-04 DIAGNOSIS — M6281 Muscle weakness (generalized): Secondary | ICD-10-CM | POA: Diagnosis not present

## 2023-01-04 DIAGNOSIS — R252 Cramp and spasm: Secondary | ICD-10-CM | POA: Insufficient documentation

## 2023-01-04 DIAGNOSIS — M1712 Unilateral primary osteoarthritis, left knee: Secondary | ICD-10-CM | POA: Diagnosis not present

## 2023-01-11 ENCOUNTER — Ambulatory Visit: Payer: Federal, State, Local not specified - PPO | Admitting: Physical Therapy

## 2023-01-11 ENCOUNTER — Encounter: Payer: Self-pay | Admitting: Physical Therapy

## 2023-01-11 DIAGNOSIS — R2689 Other abnormalities of gait and mobility: Secondary | ICD-10-CM | POA: Diagnosis not present

## 2023-01-11 DIAGNOSIS — M6281 Muscle weakness (generalized): Secondary | ICD-10-CM | POA: Diagnosis not present

## 2023-01-11 DIAGNOSIS — R252 Cramp and spasm: Secondary | ICD-10-CM

## 2023-01-11 DIAGNOSIS — M1712 Unilateral primary osteoarthritis, left knee: Secondary | ICD-10-CM | POA: Diagnosis not present

## 2023-01-11 DIAGNOSIS — M25562 Pain in left knee: Secondary | ICD-10-CM | POA: Diagnosis not present

## 2023-01-11 NOTE — Therapy (Signed)
OUTPATIENT PHYSICAL THERAPY LOWER EXTREMITY EVALUATION   Patient Name: Angelica Crosby MRN: [id number] DOB:12-07-1990, 32 y.o., female Today's Date: 01/11/2023  END OF SESSION:  PT End of Session - 01/11/23 1153     Visit Number 2    Date for PT Re-Evaluation 02/15/23    Authorization Type BCBS    PT Start Time 1153    PT Stop Time 1238    PT Time Calculation (min) 45 min    Activity Tolerance Patient tolerated treatment well    Behavior During Therapy [facility] for tasks assessed/performed             Past Medical History:  Diagnosis Date   Asthma    Eczema    Sickle cell trait (HCC)    Urticaria    Past Surgical History:  Procedure Laterality Date   HERNIA REPAIR     umbilical   OVARY SURGERY  08/2020   polyp removed   Patient Active Problem List   Diagnosis Date Noted   Morbid obesity (HCC) 11/23/2022   Vaginal discharge 11/23/2022   Vitamin D deficiency 03/29/2022   Sickle cell trait (HCC)    Fatigue 11/23/2018   Irritable bowel 11/23/2018   Allergic rhinitis 11/19/2014   Mild intermittent asthma 11/19/2014    PCP: Karie Georges, MD  REFERRING PROVIDER: Karie Georges, MD  REFERRING DIAG:  [phone] (ICD-10-CM) - Left knee pain, unspecified chronicity  M17.12 (ICD-10-CM) - Patellofemoral arthritis of left knee   THERAPY DIAG:  Acute pain of left knee  Muscle weakness (generalized)  Other abnormalities of gait and mobility  Cramp and spasm  Rationale for Evaluation and Treatment: Rehabilitation  ONSET DATE: February 2024  NEXT MD VISIT: none scheduled   SUBJECTIVE:   SUBJECTIVE STATEMENT: Pt report 5/10 pain in L knee last night but denies any pain currently. She notes tightness in B anterior thighs.  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 Then came home and climbed 3 flights of stairs. When she went to get up after lying down she couldn't get up for 2 days. Presently she has a burning  feeling and sometimes her knee buckles. Starts to feel after 4-5 hours. Had one cortisone shots about 2 months ago. Looking to get gel injection if insurance approves it.  PAIN:  Are you having pain? No  PERTINENT HISTORY:  Patellofemoral OA  PRECAUTIONS: None  RED FLAGS: None   WEIGHT BEARING RESTRICTIONS: No  FALLS:  Has patient fallen in last 6 months? No  LIVING ENVIRONMENT: Lives with: lives alone Lives in: House/apartment Stairs: Yes: External: 35 steps; can reach both Has following equipment at home: None  OCCUPATION: [employer]  PLOF: Independent  PATIENT GOALS: get my knee back to normal, for it to feel stable    OBJECTIVE:   DIAGNOSTIC FINDINGS:  MRI 10/17/22 IMPRESSION: 1. No meniscal or ligamentous injury of the left knee. 2. Partial-thickness cartilage loss of the trochlear groove of and medial trochlea.  PATIENT SURVEYS:  LEFS 44 / 80 = 55.0 %  COGNITION: Overall cognitive status: Within functional limits for tasks assessed     SENSATION: Some tingling laterally  MUSCLE LENGTH: R HS and piriformis tightness, L WNL, quads WNL  POSTURE: increased lumbar lordosis and B hip IR   PALPATION: Patellar glides laterally B; no pain when patellar blocked  LOWER EXTREMITY ROM:  Active ROM Right eval Left eval  Hip flexion    Hip  extension    Hip abduction    Hip adduction    Hip internal rotation    Hip external rotation    Knee flexion 122 125  Knee extension 3 hyperext 3 hyperext  Ankle dorsiflexion    Ankle plantarflexion    Ankle inversion    Ankle eversion     (Blank rows = not tested)  LOWER EXTREMITY MMT:  MMT Right eval Left eval  Hip flexion 5 5  Hip extension 5 5  Hip abduction 5 5  Hip adduction  5  Hip internal rotation    Hip external rotation    Knee flexion 4+ 4+  Knee extension 5 5  Ankle dorsiflexion 5 5  Ankle plantarflexion    Ankle inversion    Ankle eversion     (Blank rows = not tested)  LOWER  EXTREMITY SPECIAL TESTS:  Knee special tests: Lateral pull sign: positive   FUNCTIONAL TESTS:  5 times sit to stand: 20.5 sec Step ups WNL Step downs - eccentric weakness Stairs - ecentric weakness B, increase trunk lean left  and forward with ascending  GAIT: Distance walked: 60 Assistive device utilized: None Level of assistance: Complete Independence Comments: B hip IR   TODAY'S TREATMENT:                                                                                                                              DATE:    01/11/23 THERAPEUTIC EXERCISE: to improve flexibility, strength and mobility.  Demonstration, verbal and tactile cues throughout for technique.  Rec Bike - L2 x 6 min Prone quad stretch with strap & towel roll under thigh 3 x 30" bil Supine L quad set  with towel roll 10 x 5" Hooklying L SAQ 10 x 3" Hooklying L quad set + SLR 10 x 3" Hooklying L SLR + ER 10 x 3" Long-sitting plantar fascia stretch with towel x 30" Standing plantar fascia/gastroc stretch with toes curled on furniture leg x 30" Standing plantar fascia/gastroc stretch with negative heel over edge of step x 30"  MANUAL THERAPY: To promote reduced pain and promote normal patellar tracking .  Ktape applied to L knee/patella to promote medial patellar glide   01/04/23  See pt ed and HEP   PATIENT EDUCATION:  Education details: HEP review, HEP update - quad and plantar fascia stretches, Ktape application, and Ktape wearing and removal instructions Person educated: Patient Education method: Explanation, Demonstration, and Handouts Education comprehension: verbalized understanding and returned demonstration  HOME EXERCISE PROGRAM: Access Code: [email or web address] URL: [email or web address] Date: 01/11/2023 Prepared by: Glenetta Hew  Exercises - Supine Quad Set  - 1 x daily - 7 x weekly - 2-3 sets - 10 reps - 5 sec hold - Supine Knee Extension Strengthening  - 1 x daily - 7 x weekly - 2-3  sets - 10 reps - 5 sec hold - Supine Active Straight Leg Raise (Mirrored)  - 1 x daily -  7 x weekly - 3 sets - 10 reps - Straight Leg Raise with External Rotation  - 1 x daily - 7 x weekly - 3 sets - 10 reps - Prone Quad Stretch with Towel Roll and Strap  - 2 x daily - 7 x weekly - 3 reps - 30 sec hold - Long Sitting Plantar Fascia Stretch with Towel  - 2 x daily - 7 x weekly - 3 reps - 30 sec hold  Patient Education - Kinesiology tape   ASSESSMENT:  CLINICAL IMPRESSION: Denisha reports increased L knee pain last night but denies pain today, however notes tightness in bilateral quads.  Provided instruction in stretching for quads as well as stretching options for plantar fascia as she notes chronic plantar fasciitis.  Initial HEP reviewed with minor corrections/clarifications provided for positioning, movement patterns and hold times with patient able to perform good return demonstration following review - STG met.  Trial of kinesiotaping for L patellar medial glide initiated to promote improved L patellar tracking, with wearing instructions and removal education provided.  Aditi will benefit from skilled PT to address flexibility, strength and patellar tracking deficits to improve mobility and activity tolerance with decreased pain interference.    OBJECTIVE IMPAIRMENTS: decreased activity tolerance, decreased strength, increased muscle spasms, impaired flexibility, postural dysfunction, and pain.   ACTIVITY LIMITATIONS: standing, stairs, and locomotion level  PARTICIPATION LIMITATIONS:  pt works through pain  PERSONAL FACTORS: Fitness and Time since onset of injury/illness/exacerbation are also affecting patient's functional outcome.   REHAB POTENTIAL: Good  CLINICAL DECISION MAKING: Stable/uncomplicated  EVALUATION COMPLEXITY: Low   GOALS:  Goals reviewed with patient? Yes  SHORT TERM GOALS: Target date: 01/18/2023    Ind with initial HEP Baseline: Goal status: MET   01/11/23  LONG TERM GOALS: Target date: 02/15/2023   Ind with advanced HEP and its progression Baseline:  Goal status: IN PROGRESS  2. Decreased pain with standing and walking by 75% or more to improve QOL. Baseline:  Goal status: IN PROGRESS  3.  Improved 5xSTS to 15 sec or less demonstrating improved functional strength. Baseline: 20 sec Goal status: IN PROGRESS  4.  Able to climb stairs with a reciprocal gait pattern and good eccentric quad control. Baseline:  Goal status: IN PROGRESS  5.  Improved LEFS to 53/80 showing functional improvement. Baseline: 44 / 80 = 55.0 % Goal status: IN PROGRESS   PLAN:  PT FREQUENCY: 2x/week  PT DURATION: 6 weeks  PLANNED INTERVENTIONS: Therapeutic exercises, Therapeutic activity, Neuromuscular re-education, Balance training, Gait training, Patient/Family education, Self Care, Joint mobilization, Stair training, Aquatic Therapy, Dry Needling, Electrical stimulation, Cryotherapy, Moist heat, Taping, Vasopneumatic device, Ionotophoresis 4mg /ml Dexamethasone, and Manual therapy  PLAN FOR NEXT SESSION: Assess response to taping; work on knee strength especially eccentric - review and progress HEP PRN; DN/MT to left quads; continued taping for medial glide as benefit noted with instruction in self application as indicated; stairs   Marry Guan, PT  01/11/2023, 1:06 PM  [facility] [address] [place], [place] [address] [phone]  Fax: [phone]

## 2023-01-31 NOTE — Therapy (Signed)
OUTPATIENT PHYSICAL THERAPY LOWER EXTREMITY TREATMENT   Patient Name: Angelica Crosby MRN: [id number] DOB:October 12, 1990, 32 y.o., female Today's Date: 02/01/2023  END OF SESSION:  PT End of Session - 02/01/23 0852     Visit Number 3    Date for PT Re-Evaluation 02/15/23    Authorization Type BCBS    PT Start Time 0850    PT Stop Time 0935    PT Time Calculation (min) 45 min    Activity Tolerance Patient tolerated treatment well    Behavior During Therapy [facility] for tasks assessed/performed              Past Medical History:  Diagnosis Date   Asthma    Eczema    Sickle cell trait (HCC)    Urticaria    Past Surgical History:  Procedure Laterality Date   HERNIA REPAIR     umbilical   OVARY SURGERY  08/2020   polyp removed   Patient Active Problem List   Diagnosis Date Noted   Morbid obesity (HCC) 11/23/2022   Vaginal discharge 11/23/2022   Vitamin D deficiency 03/29/2022   Sickle cell trait (HCC)    Fatigue 11/23/2018   Irritable bowel 11/23/2018   Allergic rhinitis 11/19/2014   Mild intermittent asthma 11/19/2014    PCP: Karie Georges, MD  REFERRING PROVIDER: Karie Georges, MD  REFERRING DIAG:  [phone] (ICD-10-CM) - Left knee pain, unspecified chronicity  M17.12 (ICD-10-CM) - Patellofemoral arthritis of left knee   THERAPY DIAG:  Acute pain of left knee  Muscle weakness (generalized)  Other abnormalities of gait and mobility  Cramp and spasm  Rationale for Evaluation and Treatment: Rehabilitation  ONSET DATE: February 2024  NEXT MD VISIT: none scheduled   SUBJECTIVE:   SUBJECTIVE STATEMENT: The tape helped but it came off the same day. No pain now. I've been doing the foot stretches.  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 Then came home and climbed 3 flights of stairs. When she went to get up after lying down she couldn't get up for 2 days. Presently she has a burning feeling and sometimes  her knee buckles. Starts to feel after 4-5 hours. Had one cortisone shots about 2 months ago. Looking to get gel injection if insurance approves it.  PAIN:  Are you having pain? No  PERTINENT HISTORY:  Patellofemoral OA  PRECAUTIONS: None  RED FLAGS: None   WEIGHT BEARING RESTRICTIONS: No  FALLS:  Has patient fallen in last 6 months? No  LIVING ENVIRONMENT: Lives with: lives alone Lives in: House/apartment Stairs: Yes: External: 35 steps; can reach both Has following equipment at home: None  OCCUPATION: [employer]  PLOF: Independent  PATIENT GOALS: get my knee back to normal, for it to feel stable    OBJECTIVE:   DIAGNOSTIC FINDINGS:  MRI 10/17/22 IMPRESSION: 1. No meniscal or ligamentous injury of the left knee. 2. Partial-thickness cartilage loss of the trochlear groove of and medial trochlea.  PATIENT SURVEYS:  LEFS 44 / 80 = 55.0 %  COGNITION: Overall cognitive status: Within functional limits for tasks assessed     SENSATION: Some tingling laterally  MUSCLE LENGTH: R HS and piriformis tightness, L WNL, quads WNL  POSTURE: increased lumbar lordosis and B hip IR   PALPATION: Patellar glides laterally B; no pain when patellar blocked  LOWER EXTREMITY ROM:  Active ROM Right eval Left eval  Hip flexion    Hip extension  Hip abduction    Hip adduction    Hip internal rotation    Hip external rotation    Knee flexion 122 125  Knee extension 3 hyperext 3 hyperext  Ankle dorsiflexion    Ankle plantarflexion    Ankle inversion    Ankle eversion     (Blank rows = not tested)  LOWER EXTREMITY MMT:  MMT Right eval Left eval  Hip flexion 5 5  Hip extension 5 5  Hip abduction 5 5  Hip adduction  5  Hip internal rotation    Hip external rotation    Knee flexion 4+ 4+  Knee extension 5 5  Ankle dorsiflexion 5 5  Ankle plantarflexion    Ankle inversion    Ankle eversion     (Blank rows = not tested)  LOWER EXTREMITY SPECIAL  TESTS:  Knee special tests: Lateral pull sign: positive   FUNCTIONAL TESTS:  5 times sit to stand: 20.5 sec Step ups WNL Step downs - eccentric weakness Stairs - ecentric weakness B, increase trunk lean left  and forward with ascending  GAIT: Distance walked: 60 Assistive device utilized: None Level of assistance: Complete Independence Comments: B hip IR   TODAY'S TREATMENT:                                                                                                                              DATE:   02/01/23 THERAPEUTIC EXERCISE: to improve flexibility, strength and mobility.  Demonstration, verbal and tactile cues throughout for technique.  Nustep  - L5 x 6 min Tape applied Prone quad stretch with strap & towel roll under thigh 3 x 30" bil Supine L quad set  with towel roll 10 x 5" Hooklying L SAQ 10 x 3" Hooklying L quad set + SLR  x 10, then 10 x 3" Hooklying L SLR + ER 10 x 3" Functional squat x 10 Step ups L 4 inch and 6 inch x 10 ea Step downs R 4 inch and 6 inch x 10 ea  Eccentric heel taps 4 in on L LE 2x10 Star taps L LE x 5 Single leg forward T L LE x 10   MANUAL THERAPY: To promote reduced pain and promote normal patellar tracking.  Ktape applied to L knee/patella to promote medial patellar glide    01/11/23 THERAPEUTIC EXERCISE: to improve flexibility, strength and mobility.  Demonstration, verbal and tactile cues throughout for technique.  Rec Bike - L2 x 6 min Prone quad stretch with strap & towel roll under thigh 3 x 30" bil Supine L quad set  with towel roll 10 x 5" Hooklying L SAQ 10 x 3" Hooklying L quad set + SLR 10 x 3" Hooklying L SLR + ER 10 x 3" Long-sitting plantar fascia stretch with towel x 30" Standing plantar fascia/gastroc stretch with toes curled on furniture leg x 30" Standing plantar fascia/gastroc stretch with negative heel over edge of step x  30"  MANUAL THERAPY: To promote reduced pain and promote normal patellar tracking .   Ktape applied to L knee/patella to promote medial patellar glide   01/04/23  See pt ed and HEP   PATIENT EDUCATION:  Education details: HEP update Person educated: Patient Education method: Explanation, Demonstration, and Handouts Education comprehension: verbalized understanding and returned demonstration  HOME EXERCISE PROGRAM: Access Code: [email or web address] URL: [email or web address] Date: 02/01/2023 Prepared by: Raynelle Fanning  Exercises - Supine Quad Set  - 1 x daily - 7 x weekly - 2-3 sets - 10 reps - 5 sec hold - Supine Knee Extension Strengthening  - 1 x daily - 7 x weekly - 2-3 sets - 10 reps - 5 sec hold - Supine Active Straight Leg Raise (Mirrored)  - 1 x daily - 7 x weekly - 3 sets - 10 reps - Straight Leg Raise with External Rotation  - 1 x daily - 7 x weekly - 3 sets - 10 reps - Prone Quad Stretch with Towel Roll and Strap  - 2 x daily - 7 x weekly - 3 reps - 30 sec hold - Long Sitting Plantar Fascia Stretch with Towel  - 2 x daily - 7 x weekly - 3 reps - 30 sec hold - Squat with Chair Touch  - 1 x daily - 3-4 x weekly - 1-2 sets - 10 reps - Star Taps  - 1 x daily - 3-4 x weekly - 1-2 sets - 10 reps - Forward T with Counter Support  - 1 x daily - 3-4 x weekly - 1-3 sets - 10 reps  Patient Education - Kinesiology tape   ASSESSMENT:  CLINICAL IMPRESSION: Nejla had relief with KT tape, but it came off the same day. We reapplied after Nu step and then proceeded with TE. She tolerated all exercises without complaint of pain. She did report fatigue with many exercises and had to take intermittent breaks within her sets. She will benefit from ongoing functional strengthening.      OBJECTIVE IMPAIRMENTS: decreased activity tolerance, decreased strength, increased muscle spasms, impaired flexibility, postural dysfunction, and pain.   ACTIVITY LIMITATIONS: standing, stairs, and locomotion level  PARTICIPATION LIMITATIONS:  pt works through pain  PERSONAL FACTORS:  Fitness and Time since onset of injury/illness/exacerbation are also affecting patient's functional outcome.   REHAB POTENTIAL: Good  CLINICAL DECISION MAKING: Stable/uncomplicated  EVALUATION COMPLEXITY: Low   GOALS:  Goals reviewed with patient? Yes  SHORT TERM GOALS: Target date: 01/18/2023    Ind with initial HEP Baseline: Goal status: MET  01/11/23  LONG TERM GOALS: Target date: 02/15/2023   Ind with advanced HEP and its progression Baseline:  Goal status: IN PROGRESS  2. Decreased pain with standing and walking by 75% or more to improve QOL. Baseline:  Goal status: IN PROGRESS  3.  Improved 5xSTS to 15 sec or less demonstrating improved functional strength. Baseline: 20 sec Goal status: IN PROGRESS  4.  Able to climb stairs with a reciprocal gait pattern and good eccentric quad control. Baseline:  Goal status: IN PROGRESS  5.  Improved LEFS to 53/80 showing functional improvement. Baseline: 44 / 80 = 55.0 % Goal status: IN PROGRESS   PLAN:  PT FREQUENCY: 2x/week  PT DURATION: 6 weeks  PLANNED INTERVENTIONS: Therapeutic exercises, Therapeutic activity, Neuromuscular re-education, Balance training, Gait training, Patient/Family education, Self Care, Joint mobilization, Stair training, Aquatic Therapy, Dry Needling, Electrical stimulation, Cryotherapy, Moist heat, Taping, Vasopneumatic device, Ionotophoresis 4mg /ml Dexamethasone, and Manual therapy  PLAN  FOR NEXT SESSION: Assess response to taping; work on knee strength especially eccentric - review and progress HEP PRN; DN/MT to left quads; continued taping for medial glide as benefit noted with instruction in self application as indicated; stairs   Solon Palm, PT   02/01/2023, 9:44 AM  [facility] [address] [place], [place] [address] [phone]  Fax: [phone]

## 2023-02-01 ENCOUNTER — Encounter: Payer: Self-pay | Admitting: Physical Therapy

## 2023-02-01 ENCOUNTER — Ambulatory Visit: Payer: Federal, State, Local not specified - PPO | Attending: Surgical | Admitting: Physical Therapy

## 2023-02-01 DIAGNOSIS — R2689 Other abnormalities of gait and mobility: Secondary | ICD-10-CM | POA: Insufficient documentation

## 2023-02-01 DIAGNOSIS — M25562 Pain in left knee: Secondary | ICD-10-CM | POA: Insufficient documentation

## 2023-02-01 DIAGNOSIS — R252 Cramp and spasm: Secondary | ICD-10-CM | POA: Insufficient documentation

## 2023-02-01 DIAGNOSIS — M6281 Muscle weakness (generalized): Secondary | ICD-10-CM | POA: Insufficient documentation

## 2023-02-02 ENCOUNTER — Other Ambulatory Visit: Payer: Self-pay

## 2023-02-02 ENCOUNTER — Ambulatory Visit: Payer: Federal, State, Local not specified - PPO

## 2023-02-02 ENCOUNTER — Encounter (INDEPENDENT_AMBULATORY_CARE_PROVIDER_SITE_OTHER): Payer: Self-pay

## 2023-02-02 DIAGNOSIS — R252 Cramp and spasm: Secondary | ICD-10-CM

## 2023-02-02 DIAGNOSIS — R2689 Other abnormalities of gait and mobility: Secondary | ICD-10-CM | POA: Diagnosis not present

## 2023-02-02 DIAGNOSIS — M25562 Pain in left knee: Secondary | ICD-10-CM | POA: Diagnosis not present

## 2023-02-02 DIAGNOSIS — M6281 Muscle weakness (generalized): Secondary | ICD-10-CM

## 2023-02-02 NOTE — Therapy (Signed)
OUTPATIENT PHYSICAL THERAPY LOWER EXTREMITY TREATMENT   Patient Name: Angelica Crosby MRN: [id number] DOB:1990-09-22, 32 y.o., female Today's Date: 02/02/2023  END OF SESSION:  PT End of Session - 02/02/23 0849     Visit Number 4    Date for PT Re-Evaluation 02/15/23    Authorization Type BCBS    Progress Note Due on Visit 10    PT Start Time 0846    PT Stop Time 0930    PT Time Calculation (min) 44 min    Activity Tolerance Patient tolerated treatment well    Behavior During Therapy [facility] for tasks assessed/performed               Past Medical History:  Diagnosis Date   Asthma    Eczema    Sickle cell trait (HCC)    Urticaria    Past Surgical History:  Procedure Laterality Date   HERNIA REPAIR     umbilical   OVARY SURGERY  08/2020   polyp removed   Patient Active Problem List   Diagnosis Date Noted   Morbid obesity (HCC) 11/23/2022   Vaginal discharge 11/23/2022   Vitamin D deficiency 03/29/2022   Sickle cell trait (HCC)    Fatigue 11/23/2018   Irritable bowel 11/23/2018   Allergic rhinitis 11/19/2014   Mild intermittent asthma 11/19/2014    PCP: Karie Georges, MD  REFERRING PROVIDER: Karie Georges, MD  REFERRING DIAG:  [phone] (ICD-10-CM) - Left knee pain, unspecified chronicity  M17.12 (ICD-10-CM) - Patellofemoral arthritis of left knee   THERAPY DIAG:  Acute pain of left knee  Other abnormalities of gait and mobility  Muscle weakness (generalized)  Cramp and spasm  Rationale for Evaluation and Treatment: Rehabilitation  ONSET DATE: February 2024  NEXT MD VISIT: none scheduled   SUBJECTIVE:   SUBJECTIVE STATEMENT: The tape helped but it came off the same day. No pain now. I've been doing the foot stretches.  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 Then came home and climbed 3 flights of stairs. When she went to get up after lying down she couldn't get up for 2 days. Presently she  has a burning feeling and sometimes her knee buckles. Starts to feel after 4-5 hours. Had one cortisone shots about 2 months ago. Looking to get gel injection if insurance approves it.  PAIN:  Are you having pain? No  PERTINENT HISTORY:  Patellofemoral OA  PRECAUTIONS: None  RED FLAGS: None   WEIGHT BEARING RESTRICTIONS: No  FALLS:  Has patient fallen in last 6 months? No  LIVING ENVIRONMENT: Lives with: lives alone Lives in: House/apartment Stairs: Yes: External: 35 steps; can reach both Has following equipment at home: None  OCCUPATION: [employer]  PLOF: Independent  PATIENT GOALS: get my knee back to normal, for it to feel stable    OBJECTIVE:   DIAGNOSTIC FINDINGS:  MRI 10/17/22 IMPRESSION: 1. No meniscal or ligamentous injury of the left knee. 2. Partial-thickness cartilage loss of the trochlear groove of and medial trochlea.  PATIENT SURVEYS:  LEFS 44 / 80 = 55.0 %  COGNITION: Overall cognitive status: Within functional limits for tasks assessed     SENSATION: Some tingling laterally  MUSCLE LENGTH: R HS and piriformis tightness, L WNL, quads WNL  POSTURE: increased lumbar lordosis and B hip IR   PALPATION: Patellar glides laterally B; no pain when patellar blocked  LOWER EXTREMITY ROM:  Active ROM Right eval  Left eval  Hip flexion    Hip extension    Hip abduction    Hip adduction    Hip internal rotation    Hip external rotation    Knee flexion 122 125  Knee extension 3 hyperext 3 hyperext  Ankle dorsiflexion    Ankle plantarflexion    Ankle inversion    Ankle eversion     (Blank rows = not tested)  LOWER EXTREMITY MMT:  MMT Right eval Left eval  Hip flexion 5 5  Hip extension 5 5  Hip abduction 5 5  Hip adduction  5  Hip internal rotation    Hip external rotation    Knee flexion 4+ 4+  Knee extension 5 5  Ankle dorsiflexion 5 5  Ankle plantarflexion    Ankle inversion    Ankle eversion     (Blank rows = not  tested)  LOWER EXTREMITY SPECIAL TESTS:  Knee special tests: Lateral pull sign: positive   FUNCTIONAL TESTS:  5 times sit to stand: 20.5 sec Step ups WNL Step downs - eccentric weakness Stairs - ecentric weakness B, increase trunk lean left  and forward with ascending  GAIT: Distance walked: 60 Assistive device utilized: None Level of assistance: Complete Independence Comments: B hip IR   TODAY'S TREATMENT:                                                                                                                              DATE:   02/02/23: Therex:  Nustep 6 min, level 5 Seated L SLR  Seated L SLR with hip ER 35 degrees Added 3 reps isometric quads ext in sitting with knee 60 degrees flex and foot planted on floor 30 to 35 sec holds Step ups 4 " with L Le and opposite knee drivers R leg , to fatigue, 15 reps  Manual:  Kinesiotape, 2 I pieces, distal to prox pull, from tibial tub to mid thigh Deep cross friction massage and myofascial release with runner's stick L quads, full length, tender medial upper thigh.    02/01/23 THERAPEUTIC EXERCISE: to improve flexibility, strength and mobility.  Demonstration, verbal and tactile cues throughout for technique.  Nustep  - L5 x 6 min Tape applied Prone quad stretch with strap & towel roll under thigh 3 x 30" bil Supine L quad set  with towel roll 10 x 5" Hooklying L SAQ 10 x 3" Hooklying L quad set + SLR  x 10, then 10 x 3" Hooklying L SLR + ER 10 x 3" Functional squat x 10 Step ups L 4 inch and 6 inch x 10 ea Step downs R 4 inch and 6 inch x 10 ea  Eccentric heel taps 4 in on L LE 2x10 Star taps L LE x 5 Single leg forward T L LE x 10   MANUAL THERAPY: To promote reduced pain and promote normal patellar tracking.  Ktape applied to L knee/patella to promote  medial patellar glide    01/11/23 THERAPEUTIC EXERCISE: to improve flexibility, strength and mobility.  Demonstration, verbal and tactile cues throughout for  technique.  Rec Bike - L2 x 6 min Prone quad stretch with strap & towel roll under thigh 3 x 30" bil Supine L quad set  with towel roll 10 x 5" Hooklying L SAQ 10 x 3" Hooklying L quad set + SLR 10 x 3" Hooklying L SLR + ER 10 x 3" Long-sitting plantar fascia stretch with towel x 30" Standing plantar fascia/gastroc stretch with toes curled on furniture leg x 30" Standing plantar fascia/gastroc stretch with negative heel over edge of step x 30"  MANUAL THERAPY: To promote reduced pain and promote normal patellar tracking .  Ktape applied to L knee/patella to promote medial patellar glide   01/04/23  See pt ed and HEP   PATIENT EDUCATION:  Education details: HEP update Person educated: Patient Education method: Explanation, Demonstration, and Handouts Education comprehension: verbalized understanding and returned demonstration  HOME EXERCISE PROGRAM: Access Code: [email or web address] URL: [email or web address] Date: 02/01/2023 Prepared by: Raynelle Fanning  Exercises - Supine Quad Set  - 1 x daily - 7 x weekly - 2-3 sets - 10 reps - 5 sec hold - Supine Knee Extension Strengthening  - 1 x daily - 7 x weekly - 2-3 sets - 10 reps - 5 sec hold - Supine Active Straight Leg Raise (Mirrored)  - 1 x daily - 7 x weekly - 3 sets - 10 reps - Straight Leg Raise with External Rotation  - 1 x daily - 7 x weekly - 3 sets - 10 reps - Prone Quad Stretch with Towel Roll and Strap  - 2 x daily - 7 x weekly - 3 reps - 30 sec hold - Long Sitting Plantar Fascia Stretch with Towel  - 2 x daily - 7 x weekly - 3 reps - 30 sec hold - Squat with Chair Touch  - 1 x daily - 3-4 x weekly - 1-2 sets - 10 reps - Star Taps  - 1 x daily - 3-4 x weekly - 1-2 sets - 10 reps - Forward T with Counter Support  - 1 x daily - 3-4 x weekly - 1-3 sets - 10 reps  Patient Education - Kinesiology tape   ASSESSMENT:  CLINICAL IMPRESSION: Yolanda had relief with KT tape,  was peeling off today so reinforced again. Palpable  crepitus L patella with knee extension.  Added isometric quads for pain management L knee.  Pain is reduced but she is careful., guarded with descending steps.  Needs progressive strengthening to control the excessive grinding under L patella.    OBJECTIVE IMPAIRMENTS: decreased activity tolerance, decreased strength, increased muscle spasms, impaired flexibility, postural dysfunction, and pain.   ACTIVITY LIMITATIONS: standing, stairs, and locomotion level  PARTICIPATION LIMITATIONS:  pt works through pain  PERSONAL FACTORS: Fitness and Time since onset of injury/illness/exacerbation are also affecting patient's functional outcome.   REHAB POTENTIAL: Good  CLINICAL DECISION MAKING: Stable/uncomplicated  EVALUATION COMPLEXITY: Low   GOALS:  Goals reviewed with patient? Yes  SHORT TERM GOALS: Target date: 01/18/2023    Ind with initial HEP Baseline: Goal status: MET  01/11/23  LONG TERM GOALS: Target date: 02/15/2023   Ind with advanced HEP and its progression Baseline:  Goal status: IN PROGRESS  2. Decreased pain with standing and walking by 75% or more to improve QOL. Baseline:  Goal status: IN PROGRESS  3.  Improved 5xSTS to 15 sec  or less demonstrating improved functional strength. Baseline: 20 sec Goal status: IN PROGRESS  4.  Able to climb stairs with a reciprocal gait pattern and good eccentric quad control. Baseline:  Goal status: IN PROGRESS  5.  Improved LEFS to 53/80 showing functional improvement. Baseline: 44 / 80 = 55.0 % Goal status: IN PROGRESS   PLAN:  PT FREQUENCY: 2x/week  PT DURATION: 6 weeks  PLANNED INTERVENTIONS: Therapeutic exercises, Therapeutic activity, Neuromuscular re-education, Balance training, Gait training, Patient/Family education, Self Care, Joint mobilization, Stair training, Aquatic Therapy, Dry Needling, Electrical stimulation, Cryotherapy, Moist heat, Taping, Vasopneumatic device, Ionotophoresis 4mg /ml Dexamethasone, and  Manual therapy  PLAN FOR NEXT SESSION: Assess response to taping; work on knee strength especially eccentric - review and progress HEP PRN; DN/MT to left quads; continued taping for medial glide as benefit noted with instruction in self application as indicated; stairs   Caralee Ates, PT, DPT, OCS   02/02/2023, 5:16 PM  [facility] [address] [place], [place] [address] [phone]  Fax: [phone]

## 2023-02-03 ENCOUNTER — Other Ambulatory Visit: Payer: Self-pay

## 2023-02-08 ENCOUNTER — Ambulatory Visit: Payer: Federal, State, Local not specified - PPO

## 2023-02-08 DIAGNOSIS — M25562 Pain in left knee: Secondary | ICD-10-CM

## 2023-02-08 DIAGNOSIS — R2689 Other abnormalities of gait and mobility: Secondary | ICD-10-CM

## 2023-02-08 DIAGNOSIS — R252 Cramp and spasm: Secondary | ICD-10-CM | POA: Diagnosis not present

## 2023-02-08 DIAGNOSIS — M6281 Muscle weakness (generalized): Secondary | ICD-10-CM | POA: Diagnosis not present

## 2023-02-08 NOTE — Therapy (Signed)
OUTPATIENT PHYSICAL THERAPY LOWER EXTREMITY TREATMENT   Patient Name: Angelica Crosby MRN: [id number] DOB:1990/10/18, 32 y.o., female Today's Date: 02/08/2023  END OF SESSION:  PT End of Session - 02/08/23 0940     Visit Number 5    Date for PT Re-Evaluation 02/15/23    Authorization Type BCBS    Progress Note Due on Visit 10    PT Start Time 0933    PT Stop Time 1015    PT Time Calculation (min) 42 min    Activity Tolerance Patient tolerated treatment well    Behavior During Therapy [facility] for tasks assessed/performed                Past Medical History:  Diagnosis Date   Asthma    Eczema    Sickle cell trait (HCC)    Urticaria    Past Surgical History:  Procedure Laterality Date   HERNIA REPAIR     umbilical   OVARY SURGERY  08/2020   polyp removed   Patient Active Problem List   Diagnosis Date Noted   Morbid obesity (HCC) 11/23/2022   Vaginal discharge 11/23/2022   Vitamin D deficiency 03/29/2022   Sickle cell trait (HCC)    Fatigue 11/23/2018   Irritable bowel 11/23/2018   Allergic rhinitis 11/19/2014   Mild intermittent asthma 11/19/2014    PCP: Karie Georges, MD  REFERRING PROVIDER: Karie Georges, MD  REFERRING DIAG:  [phone] (ICD-10-CM) - Left knee pain, unspecified chronicity  M17.12 (ICD-10-CM) - Patellofemoral arthritis of left knee   THERAPY DIAG:  Acute pain of left knee  Other abnormalities of gait and mobility  Muscle weakness (generalized)  Cramp and spasm  Rationale for Evaluation and Treatment: Rehabilitation  ONSET DATE: February 2024  NEXT MD VISIT: none scheduled   SUBJECTIVE:   SUBJECTIVE STATEMENT: Some tightness in quads, the tape only stays on for 1.5 days.  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 Then came home and climbed 3 flights of stairs. When she went to get up after lying down she couldn't get up for 2 days. Presently she has a burning feeling and  sometimes her knee buckles. Starts to feel after 4-5 hours. Had one cortisone shots about 2 months ago. Looking to get gel injection if insurance approves it.  PAIN:  Are you having pain? No  PERTINENT HISTORY:  Patellofemoral OA  PRECAUTIONS: None  RED FLAGS: None   WEIGHT BEARING RESTRICTIONS: No  FALLS:  Has patient fallen in last 6 months? No  LIVING ENVIRONMENT: Lives with: lives alone Lives in: House/apartment Stairs: Yes: External: 35 steps; can reach both Has following equipment at home: None  OCCUPATION: [employer]  PLOF: Independent  PATIENT GOALS: get my knee back to normal, for it to feel stable    OBJECTIVE:   DIAGNOSTIC FINDINGS:  MRI 10/17/22 IMPRESSION: 1. No meniscal or ligamentous injury of the left knee. 2. Partial-thickness cartilage loss of the trochlear groove of and medial trochlea.  PATIENT SURVEYS:  LEFS 44 / 80 = 55.0 %  COGNITION: Overall cognitive status: Within functional limits for tasks assessed     SENSATION: Some tingling laterally  MUSCLE LENGTH: R HS and piriformis tightness, L WNL, quads WNL  POSTURE: increased lumbar lordosis and B hip IR   PALPATION: Patellar glides laterally B; no pain when patellar blocked  LOWER EXTREMITY ROM:  Active ROM Right eval Left eval  Hip flexion  Hip extension    Hip abduction    Hip adduction    Hip internal rotation    Hip external rotation    Knee flexion 122 125  Knee extension 3 hyperext 3 hyperext  Ankle dorsiflexion    Ankle plantarflexion    Ankle inversion    Ankle eversion     (Blank rows = not tested)  LOWER EXTREMITY MMT:  MMT Right eval Left eval  Hip flexion 5 5  Hip extension 5 5  Hip abduction 5 5  Hip adduction  5  Hip internal rotation    Hip external rotation    Knee flexion 4+ 4+  Knee extension 5 5  Ankle dorsiflexion 5 5  Ankle plantarflexion    Ankle inversion    Ankle eversion     (Blank rows = not tested)  LOWER EXTREMITY  SPECIAL TESTS:  Knee special tests: Lateral pull sign: positive   FUNCTIONAL TESTS:  5 times sit to stand: 20.5 sec Step ups WNL Step downs - eccentric weakness Stairs - ecentric weakness B, increase trunk lean left  and forward with ascending  GAIT: Distance walked: 60 Assistive device utilized: None Level of assistance: Complete Independence Comments: B hip IR   TODAY'S TREATMENT:                                                                                                                              DATE:   02/08/23 Nustep L5x57min  Step downs L 6' 2x10 L Lateral step down 6' 2x10 TRX squats x 10  Single leg RDL with TRX 2 x 10  Knee extension L knee 5# 2 x 10   IASTM with rolling stick to VL, intermedius, RF  KT tape chondromalacia pattern to L knee for patellar stabilization 02/02/23: Therex:  Nustep 6 min, level 5 Seated L SLR  Seated L SLR with hip ER 35 degrees Added 3 reps isometric quads ext in sitting with knee 60 degrees flex and foot planted on floor 30 to 35 sec holds Step ups 4 " with L Le and opposite knee drivers R leg , to fatigue, 15 reps  Manual:  Kinesiotape, 2 I pieces, distal to prox pull, from tibial tub to mid thigh Deep cross friction massage and myofascial release with runner's stick L quads, full length, tender medial upper thigh.    02/01/23 THERAPEUTIC EXERCISE: to improve flexibility, strength and mobility.  Demonstration, verbal and tactile cues throughout for technique.  Nustep  - L5 x 6 min Tape applied Prone quad stretch with strap & towel roll under thigh 3 x 30" bil Supine L quad set  with towel roll 10 x 5" Hooklying L SAQ 10 x 3" Hooklying L quad set + SLR  x 10, then 10 x 3" Hooklying L SLR + ER 10 x 3" Functional squat x 10 Step ups L 4 inch and 6 inch x 10 ea Step downs R 4 inch and  6 inch x 10 ea  Eccentric heel taps 4 in on L LE 2x10 Star taps L LE x 5 Single leg forward T L LE x 10   MANUAL THERAPY: To promote  reduced pain and promote normal patellar tracking.  Ktape applied to L knee/patella to promote medial patellar glide    01/11/23 THERAPEUTIC EXERCISE: to improve flexibility, strength and mobility.  Demonstration, verbal and tactile cues throughout for technique.  Rec Bike - L2 x 6 min Prone quad stretch with strap & towel roll under thigh 3 x 30" bil Supine L quad set  with towel roll 10 x 5" Hooklying L SAQ 10 x 3" Hooklying L quad set + SLR 10 x 3" Hooklying L SLR + ER 10 x 3" Long-sitting plantar fascia stretch with towel x 30" Standing plantar fascia/gastroc stretch with toes curled on furniture leg x 30" Standing plantar fascia/gastroc stretch with negative heel over edge of step x 30"  MANUAL THERAPY: To promote reduced pain and promote normal patellar tracking .  Ktape applied to L knee/patella to promote medial patellar glide   01/04/23  See pt ed and HEP   PATIENT EDUCATION:  Education details: HEP update Person educated: Patient Education method: Explanation, Demonstration, and Handouts Education comprehension: verbalized understanding and returned demonstration  HOME EXERCISE PROGRAM: Access Code: [email or web address] URL: [email or web address] Date: 02/01/2023 Prepared by: Raynelle Fanning  Exercises - Supine Quad Set  - 1 x daily - 7 x weekly - 2-3 sets - 10 reps - 5 sec hold - Supine Knee Extension Strengthening  - 1 x daily - 7 x weekly - 2-3 sets - 10 reps - 5 sec hold - Supine Active Straight Leg Raise (Mirrored)  - 1 x daily - 7 x weekly - 3 sets - 10 reps - Straight Leg Raise with External Rotation  - 1 x daily - 7 x weekly - 3 sets - 10 reps - Prone Quad Stretch with Towel Roll and Strap  - 2 x daily - 7 x weekly - 3 reps - 30 sec hold - Long Sitting Plantar Fascia Stretch with Towel  - 2 x daily - 7 x weekly - 3 reps - 30 sec hold - Squat with Chair Touch  - 1 x daily - 3-4 x weekly - 1-2 sets - 10 reps - Star Taps  - 1 x daily - 3-4 x weekly - 1-2 sets - 10  reps - Forward T with Counter Support  - 1 x daily - 3-4 x weekly - 1-3 sets - 10 reps  Patient Education - Kinesiology tape   ASSESSMENT:  CLINICAL IMPRESSION: Angelica Crosby was able to participate in all interventions today. We worked on Network engineer with no issues. Cues were needed to avoid hip hiking/dropping with the step downs. Initiated machine strengthening for quad isolated exercises. TRP and taut bands in quads today. KT tape to L knee for patellar stabilization. Progressing with LTG #2    OBJECTIVE IMPAIRMENTS: decreased activity tolerance, decreased strength, increased muscle spasms, impaired flexibility, postural dysfunction, and pain.   ACTIVITY LIMITATIONS: standing, stairs, and locomotion level  PARTICIPATION LIMITATIONS:  pt works through pain  PERSONAL FACTORS: Fitness and Time since onset of injury/illness/exacerbation are also affecting patient's functional outcome.   REHAB POTENTIAL: Good  CLINICAL DECISION MAKING: Stable/uncomplicated  EVALUATION COMPLEXITY: Low   GOALS:  Goals reviewed with patient? Yes  SHORT TERM GOALS: Target date: 01/18/2023    Ind with initial HEP Baseline: Goal status: MET  01/11/23  LONG TERM GOALS: Target date: 02/15/2023   Ind with advanced HEP and its progression Baseline:  Goal status: IN PROGRESS  2. Decreased pain with standing and walking by 75% or more to improve QOL. Baseline:  Goal status: IN PROGRESS- 02/08/23 50% improvement  3.  Improved 5xSTS to 15 sec or less demonstrating improved functional strength. Baseline: 20 sec Goal status: IN PROGRESS  4.  Able to climb stairs with a reciprocal gait pattern and good eccentric quad control. Baseline:  Goal status: IN PROGRESS  5.  Improved LEFS to 53/80 showing functional improvement. Baseline: 44 / 80 = 55.0 % Goal status: IN PROGRESS   PLAN:  PT FREQUENCY: 2x/week  PT DURATION: 6 weeks  PLANNED INTERVENTIONS: Therapeutic exercises,  Therapeutic activity, Neuromuscular re-education, Balance training, Gait training, Patient/Family education, Self Care, Joint mobilization, Stair training, Aquatic Therapy, Dry Needling, Electrical stimulation, Cryotherapy, Moist heat, Taping, Vasopneumatic device, Ionotophoresis 4mg /ml Dexamethasone, and Manual therapy  PLAN FOR NEXT SESSION:  work on knee strength especially eccentric - review and progress HEP PRN; DN/MT to left quads; continued taping for medial glide as benefit noted with instruction in self application as indicated; stairs   Darleene Cleaver, PTA  02/08/2023, 10:47 AM  [facility] [address] [place], [place] [address] [phone]  Fax: [phone]

## 2023-02-09 ENCOUNTER — Encounter: Payer: Federal, State, Local not specified - PPO | Admitting: Physical Therapy

## 2023-02-15 ENCOUNTER — Ambulatory Visit: Payer: Federal, State, Local not specified - PPO | Admitting: Physical Therapy

## 2023-02-22 ENCOUNTER — Ambulatory Visit: Payer: Federal, State, Local not specified - PPO | Attending: Surgical

## 2023-02-22 DIAGNOSIS — M6281 Muscle weakness (generalized): Secondary | ICD-10-CM | POA: Insufficient documentation

## 2023-02-22 DIAGNOSIS — M25562 Pain in left knee: Secondary | ICD-10-CM | POA: Diagnosis not present

## 2023-02-22 DIAGNOSIS — R2689 Other abnormalities of gait and mobility: Secondary | ICD-10-CM | POA: Insufficient documentation

## 2023-02-22 DIAGNOSIS — R252 Cramp and spasm: Secondary | ICD-10-CM | POA: Diagnosis not present

## 2023-02-22 NOTE — Therapy (Addendum)
OUTPATIENT PHYSICAL THERAPY LOWER EXTREMITY TREATMENT AND DISCHARGE SUMMARY   Patient Name: TALONA THRESHER MRN: [id number] DOB:May 04, 1991, 32 y.o., female Today's Date: 02/22/2023  END OF SESSION:  PT End of Session - 02/22/23 1104     Visit Number 6    Date for PT Re-Evaluation 02/15/23    Authorization Type BCBS    Progress Note Due on Visit 10    PT Start Time 1102    Activity Tolerance Patient tolerated treatment well    Behavior During Therapy [facility] for tasks assessed/performed                Past Medical History:  Diagnosis Date   Asthma    Eczema    Sickle cell trait (HCC)    Urticaria    Past Surgical History:  Procedure Laterality Date   HERNIA REPAIR     umbilical   OVARY SURGERY  08/2020   polyp removed   Patient Active Problem List   Diagnosis Date Noted   Morbid obesity (HCC) 11/23/2022   Vaginal discharge 11/23/2022   Vitamin D deficiency 03/29/2022   Sickle cell trait (HCC)    Fatigue 11/23/2018   Irritable bowel 11/23/2018   Allergic rhinitis 11/19/2014   Mild intermittent asthma 11/19/2014    PCP: Karie Georges, MD  REFERRING PROVIDER: Karie Georges, MD  REFERRING DIAG:  [phone] (ICD-10-CM) - Left knee pain, unspecified chronicity  M17.12 (ICD-10-CM) - Patellofemoral arthritis of left knee   THERAPY DIAG:  Acute pain of left knee  Other abnormalities of gait and mobility  Muscle weakness (generalized)  Cramp and spasm  Rationale for Evaluation and Treatment: Rehabilitation  ONSET DATE: February 2024  NEXT MD VISIT: none scheduled   SUBJECTIVE:   SUBJECTIVE STATEMENT: Been doing good, knees still pop but no pain.  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 Then came home and climbed 3 flights of stairs. When she went to get up after lying down she couldn't get up for 2 days. Presently she has a burning feeling and sometimes her knee buckles. Starts to feel after 4-5 hours.  Had one cortisone shots about 2 months ago. Looking to get gel injection if insurance approves it.  PAIN:  Are you having pain? No  PERTINENT HISTORY:  Patellofemoral OA  PRECAUTIONS: None  RED FLAGS: None   WEIGHT BEARING RESTRICTIONS: No  FALLS:  Has patient fallen in last 6 months? No  LIVING ENVIRONMENT: Lives with: lives alone Lives in: House/apartment Stairs: Yes: External: 35 steps; can reach both Has following equipment at home: None  OCCUPATION: [employer]  PLOF: Independent  PATIENT GOALS: get my knee back to normal, for it to feel stable    OBJECTIVE:   DIAGNOSTIC FINDINGS:  MRI 10/17/22 IMPRESSION: 1. No meniscal or ligamentous injury of the left knee. 2. Partial-thickness cartilage loss of the trochlear groove of and medial trochlea.  PATIENT SURVEYS:  LEFS 44 / 80 = 55.0 %  COGNITION: Overall cognitive status: Within functional limits for tasks assessed     SENSATION: Some tingling laterally  MUSCLE LENGTH: R HS and piriformis tightness, L WNL, quads WNL  POSTURE: increased lumbar lordosis and B hip IR   PALPATION: Patellar glides laterally B; no pain when patellar blocked  LOWER EXTREMITY ROM:  Active ROM Right eval Left eval  Hip flexion    Hip extension    Hip abduction    Hip adduction  Hip internal rotation    Hip external rotation    Knee flexion 122 125  Knee extension 3 hyperext 3 hyperext  Ankle dorsiflexion    Ankle plantarflexion    Ankle inversion    Ankle eversion     (Blank rows = not tested)  LOWER EXTREMITY MMT:  MMT Right eval Left eval  Hip flexion 5 5  Hip extension 5 5  Hip abduction 5 5  Hip adduction  5  Hip internal rotation    Hip external rotation    Knee flexion 4+ 4+  Knee extension 5 5  Ankle dorsiflexion 5 5  Ankle plantarflexion    Ankle inversion    Ankle eversion     (Blank rows = not tested)  LOWER EXTREMITY SPECIAL TESTS:  Knee special tests: Lateral pull sign:  positive   FUNCTIONAL TESTS:  5 times sit to stand: 20.5 sec Step ups WNL Step downs - eccentric weakness Stairs - ecentric weakness B, increase trunk lean left  and forward with ascending  GAIT: Distance walked: 60 Assistive device utilized: None Level of assistance: Complete Independence Comments: B hip IR   TODAY'S TREATMENT:                                                                                                                              DATE:   02/22/23 Therapeutic Exercise: to improve strength and mobility.  Demo, verbal and tactile cues throughout for technique.  Nustep L6x68min Wall squat 10x5" Single leg squat Therapeutic Activity: to improve functional performance. Assessment of goals - see under goal section of note  02/08/23 Nustep L5x58min  Step downs L 6' 2x10 L Lateral step down 6' 2x10 TRX squats x 10  Single leg RDL with TRX 2 x 10  Knee extension L knee 5# 2 x 10   IASTM with rolling stick to VL, intermedius, RF  KT tape chondromalacia pattern to L knee for patellar stabilization 02/02/23: Therex:  Nustep 6 min, level 5 Seated L SLR  Seated L SLR with hip ER 35 degrees Added 3 reps isometric quads ext in sitting with knee 60 degrees flex and foot planted on floor 30 to 35 sec holds Step ups 4 " with L Le and opposite knee drivers R leg , to fatigue, 15 reps  Manual:  Kinesiotape, 2 I pieces, distal to prox pull, from tibial tub to mid thigh Deep cross friction massage and myofascial release with runner's stick L quads, full length, tender medial upper thigh.    02/01/23 THERAPEUTIC EXERCISE: to improve flexibility, strength and mobility.  Demonstration, verbal and tactile cues throughout for technique.  Nustep  - L5 x 6 min Tape applied Prone quad stretch with strap & towel roll under thigh 3 x 30" bil Supine L quad set  with towel roll 10 x 5" Hooklying L SAQ 10 x 3" Hooklying L quad set + SLR  x 10, then 10 x 3" Hooklying  L SLR + ER 10 x  3" Functional squat x 10 Step ups L 4 inch and 6 inch x 10 ea Step downs R 4 inch and 6 inch x 10 ea  Eccentric heel taps 4 in on L LE 2x10 Star taps L LE x 5 Single leg forward T L LE x 10   MANUAL THERAPY: To promote reduced pain and promote normal patellar tracking.  Ktape applied to L knee/patella to promote medial patellar glide    01/11/23 THERAPEUTIC EXERCISE: to improve flexibility, strength and mobility.  Demonstration, verbal and tactile cues throughout for technique.  Rec Bike - L2 x 6 min Prone quad stretch with strap & towel roll under thigh 3 x 30" bil Supine L quad set  with towel roll 10 x 5" Hooklying L SAQ 10 x 3" Hooklying L quad set + SLR 10 x 3" Hooklying L SLR + ER 10 x 3" Long-sitting plantar fascia stretch with towel x 30" Standing plantar fascia/gastroc stretch with toes curled on furniture leg x 30" Standing plantar fascia/gastroc stretch with negative heel over edge of step x 30"  MANUAL THERAPY: To promote reduced pain and promote normal patellar tracking .  Ktape applied to L knee/patella to promote medial patellar glide   01/04/23  See pt ed and HEP   PATIENT EDUCATION:  Education details: HEP update Person educated: Patient Education method: Explanation, Demonstration, and Handouts Education comprehension: verbalized understanding and returned demonstration  HOME EXERCISE PROGRAM: Access Code: [email or web address] URL: [email or web address] Date: 02/01/2023 Prepared by: Raynelle Fanning  Exercises - Supine Quad Set  - 1 x daily - 7 x weekly - 2-3 sets - 10 reps - 5 sec hold - Supine Knee Extension Strengthening  - 1 x daily - 7 x weekly - 2-3 sets - 10 reps - 5 sec hold - Supine Active Straight Leg Raise (Mirrored)  - 1 x daily - 7 x weekly - 3 sets - 10 reps - Straight Leg Raise with External Rotation  - 1 x daily - 7 x weekly - 3 sets - 10 reps - Prone Quad Stretch with Towel Roll and Strap  - 2 x daily - 7 x weekly - 3 reps - 30 sec hold - Long  Sitting Plantar Fascia Stretch with Towel  - 2 x daily - 7 x weekly - 3 reps - 30 sec hold - Squat with Chair Touch  - 1 x daily - 3-4 x weekly - 1-2 sets - 10 reps - Star Taps  - 1 x daily - 3-4 x weekly - 1-2 sets - 10 reps - Forward T with Counter Support  - 1 x daily - 3-4 x weekly - 1-3 sets - 10 reps  Patient Education - Kinesiology tape   ASSESSMENT:  CLINICAL IMPRESSION: Many has met all of her goals today. She showed a good navigation of stairs with good quad control, 5xSTS score of less than 15 seconds, and scored above 53/80 on LEFS. She has no issues with regular walking and standing. After 5 hours of work, she notices a mild pain in her L knee and by the end of her shift notices increased pain. Pt was agreeable to 30 day hold.  OBJECTIVE IMPAIRMENTS: decreased activity tolerance, decreased strength, increased muscle spasms, impaired flexibility, postural dysfunction, and pain.   ACTIVITY LIMITATIONS: standing, stairs, and locomotion level  PARTICIPATION LIMITATIONS:  pt works through pain  PERSONAL FACTORS: Fitness and Time since onset of injury/illness/exacerbation are also affecting patient's functional outcome.  REHAB POTENTIAL: Good  CLINICAL DECISION MAKING: Stable/uncomplicated  EVALUATION COMPLEXITY: Low   GOALS:  Goals reviewed with patient? Yes  SHORT TERM GOALS: Target date: 01/18/2023    Ind with initial HEP Baseline: Goal status: MET  01/11/23  LONG TERM GOALS: Target date: 02/15/2023   Ind with advanced HEP and its progression Baseline:  Goal status: MET  2. Decreased pain with standing and walking by 75% or more to improve QOL. Baseline:  Goal status: MET- 02/22/23   3.  Improved 5xSTS to 15 sec or less demonstrating improved functional strength. Baseline: 20 sec Goal status: MET- 02/22/23 12 sec no UE  4.  Able to climb stairs with a reciprocal gait pattern and good eccentric quad control. Baseline:  Goal status: MET- 02/22/23 WFL  5.   Improved LEFS to 53/80 showing functional improvement. Baseline: 44 / 80 = 55.0 % Goal status: MET- 56 / 80 = 70.0 %   PLAN:  PT FREQUENCY: 2x/week  PT DURATION: 6 weeks  PLANNED INTERVENTIONS: Therapeutic exercises, Therapeutic activity, Neuromuscular re-education, Balance training, Gait training, Patient/Family education, Self Care, Joint mobilization, Stair training, Aquatic Therapy, Dry Needling, Electrical stimulation, Cryotherapy, Moist heat, Taping, Vasopneumatic device, Ionotophoresis 4mg /ml Dexamethasone, and Manual therapy  PLAN FOR NEXT SESSION: hold from PT   Darleene Cleaver, PTA  02/22/2023, 11:05 AM  [facility] [address] [place], [place] [address] [phone]  Fax: [phone]  PHYSICAL THERAPY DISCHARGE SUMMARY  Visits from Start of Care: 6  Current functional level related to goals / functional outcomes: See above   Remaining deficits: See above   Education / Equipment: HEP/TBAND   Patient agrees to discharge. Patient goals were met. Patient is being discharged due to meeting the stated rehab goals.  Solon Palm, PT 04/05/23 2:40 PM

## 2023-02-23 ENCOUNTER — Encounter: Payer: Self-pay | Admitting: Family Medicine

## 2023-02-23 ENCOUNTER — Ambulatory Visit: Payer: Federal, State, Local not specified - PPO | Admitting: Family Medicine

## 2023-02-23 ENCOUNTER — Ambulatory Visit: Payer: Federal, State, Local not specified - PPO

## 2023-02-23 DIAGNOSIS — N760 Acute vaginitis: Secondary | ICD-10-CM

## 2023-02-23 DIAGNOSIS — B9689 Other specified bacterial agents as the cause of diseases classified elsewhere: Secondary | ICD-10-CM

## 2023-02-23 MED ORDER — PHENTERMINE HCL 15 MG PO CAPS
15.0000 mg | ORAL_CAPSULE | ORAL | 0 refills | Status: DC
Start: 2023-02-23 — End: 2023-03-06

## 2023-02-23 MED ORDER — METRONIDAZOLE 500 MG PO TABS
500.0000 mg | ORAL_TABLET | Freq: Two times a day (BID) | ORAL | 0 refills | Status: AC
Start: 2023-02-23 — End: 2023-03-02

## 2023-02-23 MED ORDER — PHENTERMINE HCL 15 MG PO CAPS: 15.0000 mg | ORAL_CAPSULE | ORAL | 0 refills | Status: DC

## 2023-02-23 NOTE — Progress Notes (Signed)
Established Patient Office Visit  Subjective   Patient ID: Angelica Crosby, female    DOB: July 01, 1990  Age: 32 y.o. MRN: [id number]  Chief Complaint  Patient presents with   Medical Management of Chronic Issues   Vaginal Discharge    Patient complains of gray, frothy discharge x4 weeks    Patient also wants to discussed weight loss today. She reports trying multiple diet and exercise programs in the past without much success.   Vaginal Discharge The patient's primary symptoms include a genital odor and vaginal discharge. The patient's pertinent negatives include no pelvic pain. This is a recurrent problem. The current episode started in the past 7 days. The problem occurs constantly. The problem has been unchanged. The patient is experiencing no pain. She is not pregnant. Pertinent negatives include no fever, nausea or rash. The vaginal discharge was grey, watery and milky. The vaginal bleeding is typical of menses.    Current Outpatient Medications  Medication Instructions   albuterol (VENTOLIN HFA) 108 (90 Base) MCG/ACT inhaler 2 puffs, Inhalation, As needed   Ascorbic Acid (VITAMIN C) 1000 MG tablet 1 tablet   cholecalciferol (VITAMIN D3) 25 MCG (1000 UNIT) tablet 1 tablet   diphenhydrAMINE (BENADRYL) 25 MG tablet 1 tablet at bedtime as needed   fluticasone (FLOVENT HFA) 110 MCG/ACT inhaler 2 puffs, Inhalation, 2 times daily   [facility] 1/35 1-35 MG-MCG tablet 1 tablet, Oral, Daily   levocetirizine (XYZAL) 5 MG tablet TAKE 1 TABLET(5 MG) BY MOUTH EVERY EVENING   metroNIDAZOLE (FLAGYL) 500 mg, Oral, 2 times daily   Olopatadine-Mometasone (RYALTRIS) 665-25 MCG/ACT SUSP Place 2 sprays per nostril 1-2 times daily as needed for nasal congestion or drainage contro   Omeprazole 20 MG TBDD 1 capsule 30 minutes before morning meal   [START ON 04/21/2023] phentermine 15 mg, Oral, BH-each morning   [START ON 03/23/2023] phentermine 15 mg, Oral, BH-each morning   phentermine 15 mg, Oral, BH-each  morning    Patient Active Problem List   Diagnosis Date Noted   Morbid obesity (HCC) 11/23/2022   Vaginal discharge 11/23/2022   Vitamin D deficiency 03/29/2022   Sickle cell trait (HCC)    Fatigue 11/23/2018   Irritable bowel 11/23/2018   Allergic rhinitis 11/19/2014   Mild intermittent asthma 11/19/2014      Review of Systems  Constitutional:  Negative for fever.  Gastrointestinal:  Negative for nausea.  Genitourinary:  Positive for vaginal discharge. Negative for pelvic pain.  Skin:  Negative for rash.      Objective:     BP 118/80 (BP Location: Left Arm, Patient Position: Sitting, Cuff Size: Large)   Pulse 75   Temp 98.7 F (37.1 C) (Oral)   Ht 5\' 2"  (1.575 m)   Wt 287 lb (130.2 kg)   LMP 02/22/2023 (Exact Date)   SpO2 99%   BMI 52.49 kg/m    Physical Exam Vitals reviewed.  Constitutional:      Appearance: Normal appearance. She is obese.  Eyes:     Conjunctiva/sclera: Conjunctivae normal.  Pulmonary:     Effort: Pulmonary effort is normal.  Skin:    General: Skin is warm and dry.  Neurological:     Mental Status: She is alert and oriented to person, place, and time. Mental status is at baseline.  Psychiatric:        Mood and Affect: Mood normal.        Behavior: Behavior normal.      No results found for any  visits on 02/23/23.    The ASCVD Risk score (Arnett DK, et al., 2019) failed to calculate for the following reasons:   The 2019 ASCVD risk score is only valid for ages 46 to 47    Assessment & Plan:  Morbid obesity (HCC) Assessment & Plan: Patient tried diet and exercise alone for the past 3 months since we talked previously. Insurance did not approve GLP medication. Will start patient on phentermine, risks/benefits discussed with patient.  Orders: -     Phentermine HCl; Take 1 capsule (15 mg total) by mouth every morning.  Dispense: 30 capsule; Refill: 0 -     Phentermine HCl; Take 1 capsule (15 mg total) by mouth every morning.   Dispense: 30 capsule; Refill: 0 -     Phentermine HCl; Take 1 capsule (15 mg total) by mouth every morning.  Dispense: 30 capsule; Refill: 0 -     Cortisol; Future  Bacterial vaginosis -     metroNIDAZOLE; Take 1 tablet (500 mg total) by mouth 2 (two) times daily for 7 days.  Dispense: 14 tablet; Refill: 0   Most likely diagnosis since she has had these symptoms in the past and this is a similar episode, will  send in metronidazole  Return in about 3 months (around 05/25/2023) for weight loss -- refil meds.    Karie Georges, MD

## 2023-02-27 NOTE — Assessment & Plan Note (Signed)
Patient tried diet and exercise alone for the past 3 months since we talked previously. Insurance did not approve GLP medication. Will start patient on phentermine, risks/benefits discussed with patient.

## 2023-03-01 ENCOUNTER — Other Ambulatory Visit (INDEPENDENT_AMBULATORY_CARE_PROVIDER_SITE_OTHER): Payer: Federal, State, Local not specified - PPO

## 2023-03-01 LAB — CORTISOL: Cortisol, Plasma: 9.4 ug/dL

## 2023-03-06 ENCOUNTER — Telehealth: Payer: Self-pay

## 2023-03-06 ENCOUNTER — Encounter: Payer: Self-pay | Admitting: Family Medicine

## 2023-03-06 NOTE — Telephone Encounter (Signed)
Pharmacy Patient Advocate Encounter   Received notification from CoverMyMeds that prior authorization for Phentermine HCl 15MG  capsules is required/requested.   Insurance verification completed.   The patient is insured through [facility] .   Per test claim: PA required; PA submitted to [facility] via CoverMyMeds Key/confirmation #/EOC [id number] Status is pending

## 2023-03-07 ENCOUNTER — Other Ambulatory Visit (HOSPITAL_COMMUNITY): Payer: Self-pay

## 2023-03-07 NOTE — Telephone Encounter (Signed)
Pharmacy Patient Advocate Encounter  Received notification from [facility] that Prior Authorization for Phentermine HCl 15MG  capsules has been APPROVED from 02/04/23 to 09/02/23   PA #/Case ID/Reference #: [id number]

## 2023-03-07 NOTE — Telephone Encounter (Signed)
Spoke with DJ, pharmacy tech at [facility] and informed her of the approval as below.  She stated the patient already picked up the Rx on 9/11.

## 2023-03-09 ENCOUNTER — Ambulatory Visit: Payer: Federal, State, Local not specified - PPO | Admitting: Family Medicine

## 2023-03-31 ENCOUNTER — Encounter: Payer: Federal, State, Local not specified - PPO | Admitting: Family Medicine

## 2023-04-06 ENCOUNTER — Encounter: Payer: Self-pay | Admitting: Family Medicine

## 2023-04-13 ENCOUNTER — Other Ambulatory Visit (HOSPITAL_COMMUNITY)
Admission: RE | Admit: 2023-04-13 | Discharge: 2023-04-13 | Disposition: A | Discharge status: Home / Self Care | Payer: Federal, State, Local not specified - PPO | Source: Ambulatory Visit | Attending: *Deleted | Admitting: *Deleted

## 2023-04-13 ENCOUNTER — Ambulatory Visit (INDEPENDENT_AMBULATORY_CARE_PROVIDER_SITE_OTHER): Payer: Federal, State, Local not specified - PPO | Admitting: Family Medicine

## 2023-04-13 ENCOUNTER — Encounter: Payer: Self-pay | Admitting: Family Medicine

## 2023-04-13 VITALS — BP 102/80 | HR 75 | Temp 99.1°F | Ht 62.0 in | Wt 291.1 lb

## 2023-04-13 DIAGNOSIS — Z113 Encounter for screening for infections with a predominantly sexual mode of transmission: Secondary | ICD-10-CM | POA: Diagnosis not present

## 2023-04-13 DIAGNOSIS — N898 Other specified noninflammatory disorders of vagina: Secondary | ICD-10-CM

## 2023-04-13 DIAGNOSIS — N760 Acute vaginitis: Secondary | ICD-10-CM | POA: Insufficient documentation

## 2023-04-13 DIAGNOSIS — B3731 Acute candidiasis of vulva and vagina: Secondary | ICD-10-CM | POA: Insufficient documentation

## 2023-04-13 DIAGNOSIS — B9689 Other specified bacterial agents as the cause of diseases classified elsewhere: Secondary | ICD-10-CM | POA: Insufficient documentation

## 2023-04-13 NOTE — Progress Notes (Signed)
   Acute Office Visit  Subjective:     Patient ID: Angelica Crosby, female    DOB: 1991-05-29, 32 y.o.   MRN: [id number]  Chief Complaint  Patient presents with   Vaginal Discharge    Patient complains of yellow-green discharge x1 week, states she had intercourse one week ago, condom was used and noticed later this was broken   Vaginal Itching    X1 week   Sinus Problem    Patient complains of an itchy nose x1 week, tried nasal spray with no relief    Vaginal Discharge The patient's primary symptoms include vaginal discharge.  Vaginal Itching The patient's primary symptoms include vaginal discharge.  Sinus Problem   Patient is in today for abnormal vaginal discharge for 1 week. States that she has had a bit of lower abdominal pain recently, states that the vaginal area has been very itchy, no dysuria but the skin on the outside feels irritated. No fever or chills. States that the discharge is yellowish/ green.    Pt is reporting that she has had nasal itching/dryness-- has used restalsis and flonase spray, we discussed adding nasal saline spray and she was counseled on this.   Review of Systems  Genitourinary:  Positive for vaginal discharge.  All other systems reviewed and are negative.       Objective:    BP 102/80 (BP Location: Left Arm, Patient Position: Sitting, Cuff Size: Large)   Pulse 75   Temp 99.1 F (37.3 C) (Oral)   Ht 5\' 2"  (1.575 m)   Wt 291 lb 1.6 oz (132 kg)   LMP 03/16/2023 (Exact Date)   SpO2 98%   BMI 53.24 kg/m     Physical Exam Constitutional:      General: She is not in acute distress.    Appearance: Normal appearance. She is obese.  Pulmonary:     Effort: Pulmonary effort is normal.  Neurological:     Mental Status: She is alert and oriented to person, place, and time. Mental status is at baseline.  Psychiatric:        Mood and Affect: Mood normal.        Behavior: Behavior normal.     No results found for any visits on  04/13/23.      Assessment & Plan:   Problem List Items Addressed This Visit       Unprioritized   Vaginal discharge - Primary   Relevant Orders   Cervicovaginal ancillary only  I have ordered aptima swab to look for the pathogen responsible, will treat when the organism has been identified.   No orders of the defined types were placed in this encounter.   No follow-ups on file.  Karie Georges, MD

## 2023-04-13 NOTE — Patient Instructions (Signed)
Use nasal saline several times a day for nasal dryness

## 2023-04-17 LAB — CERVICOVAGINAL ANCILLARY ONLY
Bacterial Vaginitis (gardnerella): POSITIVE — AB
Candida Glabrata: NEGATIVE
Candida Vaginitis: POSITIVE — AB
Chlamydia: NEGATIVE
Comment: NEGATIVE
Comment: NEGATIVE
Comment: NEGATIVE
Comment: NEGATIVE
Comment: NEGATIVE
Comment: NORMAL
Neisseria Gonorrhea: NEGATIVE
Trichomonas: NEGATIVE

## 2023-04-17 MED ORDER — METRONIDAZOLE 500 MG PO TABS
500.0000 mg | ORAL_TABLET | Freq: Two times a day (BID) | ORAL | 0 refills | Status: AC
Start: 2023-04-17 — End: 2023-04-24

## 2023-04-17 MED ORDER — FLUCONAZOLE 150 MG PO TABS: 150.0000 mg | ORAL_TABLET | ORAL | 0 refills | Status: DC

## 2023-04-17 NOTE — Addendum Note (Signed)
Addended by: Karie Georges on: 04/17/2023 11:19 AM   Modules accepted: Orders

## 2023-05-17 ENCOUNTER — Ambulatory Visit: Payer: Federal, State, Local not specified - PPO | Admitting: Allergy

## 2023-05-25 ENCOUNTER — Encounter: Payer: Federal, State, Local not specified - PPO | Admitting: Family Medicine

## 2023-08-03 ENCOUNTER — Telehealth: Payer: Federal, State, Local not specified - PPO | Admitting: Nurse Practitioner

## 2023-08-03 DIAGNOSIS — J0140 Acute pansinusitis, unspecified: Secondary | ICD-10-CM

## 2023-08-03 MED ORDER — FLUTICASONE PROPIONATE 50 MCG/ACT NA SUSP: 2.0000 | Freq: Every day | NASAL | 6 refills | Status: AC

## 2023-08-03 MED ORDER — DOXYCYCLINE HYCLATE 100 MG PO TABS: 100.0000 mg | ORAL_TABLET | Freq: Two times a day (BID) | ORAL | 0 refills | Status: AC

## 2023-08-03 NOTE — Progress Notes (Signed)
 Virtual Visit Consent   Angelica Crosby, you are scheduled for a virtual visit with a [facility] provider today. Just as with appointments in the office, your consent must be obtained to participate. Your consent will be active for this visit and any virtual visit you may have with one of our providers in the next 365 days. If you have a MyChart account, a copy of this consent can be sent to you electronically.  As this is a virtual visit, video technology does not allow for your provider to perform a traditional examination. This may limit your provider's ability to fully assess your condition. If your provider identifies any concerns that need to be evaluated in person or the need to arrange testing (such as labs, EKG, etc.), we will make arrangements to do so. Although advances in technology are sophisticated, we cannot ensure that it will always work on either your end or our end. If the connection with a video visit is poor, the visit may have to be switched to a telephone visit. With either a video or telephone visit, we are not always able to ensure that we have a secure connection.  By engaging in this virtual visit, you consent to the provision of healthcare and authorize for your insurance to be billed (if applicable) for the services provided during this visit. Depending on your insurance coverage, you may receive a charge related to this service.  I need to obtain your verbal consent now. Are you willing to proceed with your visit today? Angelica Crosby has provided verbal consent on 08/03/2023 for a virtual visit (video or telephone). Angelica Simas, FNP  Date: 08/03/2023 3:36 PM   Virtual Visit via Video Note   I, Angelica Crosby, connected with  Angelica Crosby  ([id number], 07-Feb-1991) on 08/03/23 at  3:45 PM EST by a video-enabled telemedicine application and verified that I am speaking with the correct person using two identifiers.  Location: Patient: Virtual Visit  Location Patient: Home Provider: Virtual Visit Location Provider: Home Office   I discussed the limitations of evaluation and management by telemedicine and the availability of in person appointments. The patient expressed understanding and agreed to proceed.    History of Present Illness: Angelica Crosby is a 33 y.o. who identifies as a female who was assigned female at birth, and is being seen today for symptoms of chills without fever on 08/01/23 and sinus congestion returned with sinus pressure   She has some body aches today   Last week she had sinus congestion that resolved over the past weekend but sinus pressure persisted without runny nose   She also has a cough that is dry   She did not have a flu vaccine    Problems: There are no active problems to display for this patient.   Allergies  Allergen Reactions   Amoxicillin Rash    Medications: No current outpatient medications on file.  Observations/Objective: Patient is well-developed, well-nourished in no acute distress.  Resting comfortably  at home.  Head is normocephalic, atraumatic.  No labored breathing.  Speech is clear and coherent with logical content.  Patient is alert and oriented at baseline.    Assessment and Plan:  1. Acute non-recurrent pansinusitis (Primary) Continue over the counter cough support (mucinex)  Tylenol/advil as needed    Current Outpatient Medications:    doxycycline (VIBRA-TABS) 100 MG tablet, Take 1 tablet (100 mg total) by mouth 2 (two) times daily for 7 days., Disp: 14 tablet,  Rfl: 0   fluticasone (FLONASE) 50 MCG/ACT nasal spray, Place 2 sprays into both nostrils daily., Disp: 16 g, Rfl: 6      Follow Up Instructions: I discussed the assessment and treatment plan with the patient. The patient was provided an opportunity to ask questions and all were answered. The patient agreed with the plan and demonstrated an understanding of the instructions.  A copy of instructions  were sent to the patient via MyChart unless otherwise noted below.    The patient was advised to call back or seek an in-person evaluation if the symptoms worsen or if the condition fails to improve as anticipated.    Angelica Simas, FNP

## 2023-08-16 ENCOUNTER — Ambulatory Visit: Payer: Federal, State, Local not specified - PPO | Admitting: Family Medicine

## 2023-08-16 VITALS — BP 116/94 | HR 85 | Temp 98.2°F | Ht 62.0 in | Wt 289.4 lb

## 2023-08-16 DIAGNOSIS — J069 Acute upper respiratory infection, unspecified: Secondary | ICD-10-CM | POA: Diagnosis not present

## 2023-08-16 DIAGNOSIS — J029 Acute pharyngitis, unspecified: Secondary | ICD-10-CM

## 2023-08-16 DIAGNOSIS — R059 Cough, unspecified: Secondary | ICD-10-CM | POA: Diagnosis not present

## 2023-08-16 LAB — POCT INFLUENZA A/B
Influenza A, POC: NEGATIVE
Influenza B, POC: NEGATIVE

## 2023-08-16 LAB — POC COVID19 BINAXNOW: SARS Coronavirus 2 Ag: NEGATIVE

## 2023-08-16 LAB — POCT RAPID STREP A (OFFICE): Rapid Strep A Screen: NEGATIVE

## 2023-08-16 MED ORDER — AZITHROMYCIN 250 MG PO TABS
ORAL_TABLET | ORAL | 0 refills | Status: AC
Start: 2023-08-16 — End: 2023-08-21

## 2023-08-16 NOTE — Patient Instructions (Addendum)
-  Negative for influenza, covid, and strep throat. -Prescribed Azithromycin 250mg  tablet, take 2 tablets today and 1 tablet the 2nd-5th day.  -Continue to use Mucinex and Flonase.  -Alternate Tylenol 1000mg  and Ibuprofen 600-800mg  every 4 hours for pain, headache, and body aches. Recommend to eat something when taking Ibuprofen.   -Recommend to take Vitamin D 1,000IU; Vitamin C 1,000mg ; and Zinc 50mg  daily for 30 days to support your immune system. If you choose to take Zinc, you may take it with food.  -Rest, hydrate.  -Follow up if not improved.

## 2023-08-16 NOTE — Progress Notes (Signed)
 Acute Office Visit   Subjective:  Patient ID: Angelica Crosby, female    DOB: 01/05/1991, 33 y.o.   MRN: [id number]  Chief Complaint  Patient presents with   Cough    Pt reports she is coughing up yellow/green phlegm. Sx started 2 wks. Did tested negative for covid and flu a wk and half ago. Pt reports taking mucinex   Sinus Problem    Pt c/o tightness and pressure under eyes.    Sore Throat    Sx started 4-5 days go.    Fatigue    Cough  Sinus Problem Associated symptoms include coughing.  Sore Throat  Associated symptoms include coughing.   Patient is complaining of a productive cough, sinus pressure, sore throat, and fatigue. Phlegm is yellow, green thick.   Denies ear pain/drainage, fever, SHOB, CP, abd pain, nausea, or vomiting.  Symptoms started 2 weeks ago. She tested negative for covid and influenza 1.5 weeks ago.   She has been taking Mucinex and Flonase with some relief.   She reports she had a virtual appointment 02/13 but do not see a visit in her chart. She reports she was prescribed Doxycycline and Flonase.   Review of Systems  Respiratory:  Positive for cough.    See HPI above      Objective:   BP (!) 116/94 (BP Location: Left Arm, Patient Position: Sitting, Cuff Size: Large)   Pulse 85   Temp 98.2 F (36.8 C) (Oral)   Ht 5\' 2"  (1.575 m)   Wt 289 lb 6.4 oz (131.3 kg)   LMP 06/25/2023 (Exact Date)   SpO2 98%   BMI 52.93 kg/m    Physical Exam Vitals reviewed.  Constitutional:      General: She is not in acute distress.    Appearance: Normal appearance. She is obese. She is not ill-appearing, toxic-appearing or diaphoretic.  HENT:     Head: Normocephalic and atraumatic.     Right Ear: Tympanic membrane, ear canal and external ear normal. There is no impacted cerumen.     Left Ear: Tympanic membrane, ear canal and external ear normal. There is no impacted cerumen.     Nose:     Right Sinus: No maxillary sinus tenderness or frontal sinus  tenderness.     Left Sinus: No maxillary sinus tenderness or frontal sinus tenderness.     Mouth/Throat:     Pharynx: Oropharynx is clear. Uvula midline. No pharyngeal swelling, oropharyngeal exudate, posterior oropharyngeal erythema or uvula swelling.  Eyes:     General:        Right eye: No discharge.        Left eye: No discharge.     Conjunctiva/sclera: Conjunctivae normal.  Cardiovascular:     Rate and Rhythm: Normal rate and regular rhythm.     Heart sounds: Normal heart sounds. No murmur heard.    No friction rub. No gallop.  Pulmonary:     Effort: Pulmonary effort is normal. No respiratory distress.     Breath sounds: Normal breath sounds.  Musculoskeletal:        General: Normal range of motion.  Skin:    General: Skin is warm and dry.  Neurological:     General: No focal deficit present.     Mental Status: She is alert and oriented to person, place, and time. Mental status is at baseline.  Psychiatric:        Mood and Affect: Mood normal.  Behavior: Behavior normal. Behavior is cooperative.        Thought Content: Thought content normal.        Judgment: Judgment normal.    Results for orders placed or performed in visit on 08/16/23  POC COVID-19  Result Value Ref Range   SARS Coronavirus 2 Ag Negative Negative  POC Influenza A/B  Result Value Ref Range   Influenza A, POC Negative Negative   Influenza B, POC Negative Negative  POC Rapid Strep A  Result Value Ref Range   Rapid Strep A Screen Negative Negative      Assessment & Plan:  Upper respiratory tract infection, unspecified type -     Azithromycin; Take 2 tablets on day 1, then 1 tablet daily on days 2 through 5  Dispense: 6 tablet; Refill: 0  Cough, unspecified type -     POC COVID-19 BinaxNow -     POCT Influenza A/B  Sore throat -     POCT rapid strep A  -Negative for influenza, covid, and strep throat. -Prescribed Azithromycin 250mg  tablet, take 2 tablets today and 1 tablet the 2nd-5th  day.  -Continue to use Mucinex and Flonase.  -Alternate Tylenol 1000mg  and Ibuprofen 600-800mg  every 4 hours for pain, headache, and body aches. Recommend to eat something when taking Ibuprofen.   -Recommend to take Vitamin D 1,000IU; Vitamin C 1,000mg ; and Zinc 50mg  daily for 30 days to support your immune system. If you choose to take Zinc, you may take it with food.  -Rest, hydrate.  -Follow up if not improved.   Zandra Abts, NP

## 2023-11-02 ENCOUNTER — Encounter: Payer: Self-pay | Admitting: Family Medicine

## 2023-11-02 ENCOUNTER — Ambulatory Visit: Admitting: Family Medicine

## 2023-11-02 ENCOUNTER — Other Ambulatory Visit (HOSPITAL_COMMUNITY)
Admission: RE | Admit: 2023-11-02 | Discharge: 2023-11-02 | Disposition: A | Discharge status: Home / Self Care | Source: Ambulatory Visit | Attending: Family Medicine | Admitting: Family Medicine

## 2023-11-02 VITALS — BP 132/88 | HR 77 | Temp 98.4°F | Ht 62.0 in | Wt 295.7 lb

## 2023-11-02 DIAGNOSIS — Z114 Encounter for screening for human immunodeficiency virus [HIV]: Secondary | ICD-10-CM | POA: Diagnosis not present

## 2023-11-02 DIAGNOSIS — Z209 Contact with and (suspected) exposure to unspecified communicable disease: Secondary | ICD-10-CM

## 2023-11-02 DIAGNOSIS — N898 Other specified noninflammatory disorders of vagina: Secondary | ICD-10-CM

## 2023-11-02 DIAGNOSIS — Z7251 High risk heterosexual behavior: Secondary | ICD-10-CM | POA: Diagnosis not present

## 2023-11-02 DIAGNOSIS — Z1159 Encounter for screening for other viral diseases: Secondary | ICD-10-CM

## 2023-11-02 NOTE — Progress Notes (Signed)
   Acute Office Visit  Subjective:     Patient ID: Angelica Crosby, female    DOB: 01-10-1991, 33 y.o.   MRN: [id number]  Chief Complaint  Patient presents with   Vaginal Discharge    Patient complains of vaginal itching and brown-gray colored vaginal discharge x1 week    Vaginal Discharge The patient's primary symptoms include vaginal discharge.   Patient is in today for 1 week history of brownish gray discharge, vaginal itching like what hse had before about 6 months ago. States she does have a new sexual partner and would like to be checked for all STI's today. She denies any fever or chills, no pelvic pain or unusual vaginal bleeding. States she is using barrier methods for birth control at this time. No longer taking OCP's.   Review of Systems  Genitourinary:  Positive for vaginal discharge.  All other systems reviewed and are negative.       Objective:    BP 132/88   Pulse 77   Temp 98.4 F (36.9 C) (Oral)   Ht 5\' 2"  (1.575 m)   Wt 295 lb 11.2 oz (134.1 kg)   LMP 10/11/2023 (Exact Date)   SpO2 99%   BMI 54.08 kg/m    Physical Exam Vitals reviewed.  Constitutional:      Appearance: Normal appearance. She is obese.  Pulmonary:     Effort: Pulmonary effort is normal.  Neurological:     General: No focal deficit present.     Mental Status: She is alert and oriented to person, place, and time. Mental status is at baseline.     No results found for any visits on 11/02/23.      Assessment & Plan:   Problem List Items Addressed This Visit       Unprioritized   Vaginal discharge   Relevant Orders   Cervicovaginal ancillary only   Other Visit Diagnoses       Need for hepatitis C screening test    -  Primary   Relevant Orders   Hep C Antibody     Sexual behavior with high risk of exposure to communicable disease       Relevant Orders   RPR     Encounter for screening for HIV       Relevant Orders   HIV antibody (with reflex)     Checking labs to  rule out STI's and nuswab for the vaginal itching. Will place orders pending the results.   No orders of the defined types were placed in this encounter.   No follow-ups on file.  Aida House, MD

## 2023-11-03 LAB — CERVICOVAGINAL ANCILLARY ONLY
Bacterial Vaginitis (gardnerella): POSITIVE — AB
Candida Glabrata: NEGATIVE
Candida Vaginitis: POSITIVE — AB
Chlamydia: NEGATIVE
Comment: NEGATIVE
Comment: NEGATIVE
Comment: NEGATIVE
Comment: NEGATIVE
Comment: NEGATIVE
Comment: NORMAL
Neisseria Gonorrhea: NEGATIVE
Trichomonas: NEGATIVE

## 2023-11-03 LAB — HEPATITIS C ANTIBODY: Hepatitis C Ab: NONREACTIVE

## 2023-11-03 LAB — RPR: RPR Ser Ql: NONREACTIVE

## 2023-11-03 LAB — HIV ANTIBODY (ROUTINE TESTING W REFLEX): HIV 1&2 Ab, 4th Generation: NONREACTIVE

## 2023-11-06 ENCOUNTER — Ambulatory Visit: Payer: Self-pay | Admitting: Family Medicine

## 2023-11-06 DIAGNOSIS — B9689 Other specified bacterial agents as the cause of diseases classified elsewhere: Secondary | ICD-10-CM

## 2023-11-06 DIAGNOSIS — B3731 Acute candidiasis of vulva and vagina: Secondary | ICD-10-CM

## 2023-11-06 MED ORDER — METRONIDAZOLE 0.75 % VA GEL: 1.0000 | Freq: Every day | VAGINAL | 0 refills | Status: AC

## 2023-11-06 MED ORDER — FLUCONAZOLE 150 MG PO TABS: 150.0000 mg | ORAL_TABLET | ORAL | 0 refills | Status: DC | PRN

## 2023-11-23 ENCOUNTER — Other Ambulatory Visit (HOSPITAL_COMMUNITY)
Admission: RE | Admit: 2023-11-23 | Discharge: 2023-11-23 | Disposition: A | Discharge status: Home / Self Care | Source: Ambulatory Visit | Attending: Family Medicine | Admitting: Family Medicine

## 2023-11-23 ENCOUNTER — Other Ambulatory Visit

## 2023-11-23 DIAGNOSIS — N760 Acute vaginitis: Secondary | ICD-10-CM | POA: Diagnosis not present

## 2023-11-23 DIAGNOSIS — B9689 Other specified bacterial agents as the cause of diseases classified elsewhere: Secondary | ICD-10-CM | POA: Diagnosis not present

## 2023-11-23 DIAGNOSIS — F4322 Adjustment disorder with anxiety: Secondary | ICD-10-CM | POA: Diagnosis not present

## 2023-11-24 ENCOUNTER — Ambulatory Visit: Payer: Self-pay | Admitting: Family Medicine

## 2023-11-24 DIAGNOSIS — B3731 Acute candidiasis of vulva and vagina: Secondary | ICD-10-CM

## 2023-11-24 DIAGNOSIS — N760 Acute vaginitis: Secondary | ICD-10-CM

## 2023-11-24 LAB — CERVICOVAGINAL ANCILLARY ONLY
Bacterial Vaginitis (gardnerella): POSITIVE — AB
Candida Glabrata: NEGATIVE
Candida Vaginitis: POSITIVE — AB
Comment: NEGATIVE
Comment: NEGATIVE
Comment: NEGATIVE

## 2023-11-24 MED ORDER — FLUCONAZOLE 150 MG PO TABS: 150.0000 mg | ORAL_TABLET | ORAL | 0 refills | Status: DC | PRN

## 2023-11-24 MED ORDER — METRONIDAZOLE 0.75 % VA GEL: 1.0000 | Freq: Every day | VAGINAL | 0 refills | Status: DC

## 2024-03-05 DIAGNOSIS — R300 Dysuria: Secondary | ICD-10-CM | POA: Diagnosis not present

## 2024-03-05 DIAGNOSIS — N3090 Cystitis, unspecified without hematuria: Secondary | ICD-10-CM | POA: Diagnosis not present

## 2024-03-05 DIAGNOSIS — R102 Pelvic and perineal pain: Secondary | ICD-10-CM | POA: Diagnosis not present

## 2024-03-05 DIAGNOSIS — Z3202 Encounter for pregnancy test, result negative: Secondary | ICD-10-CM | POA: Diagnosis not present

## 2024-03-14 DIAGNOSIS — F4322 Adjustment disorder with anxiety: Secondary | ICD-10-CM | POA: Diagnosis not present

## 2024-04-16 DIAGNOSIS — F4322 Adjustment disorder with anxiety: Secondary | ICD-10-CM | POA: Diagnosis not present

## 2024-05-02 ENCOUNTER — Encounter: Admitting: Family Medicine

## 2024-07-04 ENCOUNTER — Telehealth: Admitting: Physician Assistant

## 2024-07-04 ENCOUNTER — Encounter: Admitting: Family Medicine

## 2024-07-04 DIAGNOSIS — J069 Acute upper respiratory infection, unspecified: Secondary | ICD-10-CM

## 2024-07-04 DIAGNOSIS — J4541 Moderate persistent asthma with (acute) exacerbation: Secondary | ICD-10-CM

## 2024-07-04 MED ORDER — PREDNISONE 20 MG PO TABS: 40.0000 mg | ORAL_TABLET | Freq: Every day | ORAL | 0 refills | Status: DC

## 2024-07-04 MED ORDER — PSEUDOEPH-BROMPHEN-DM 30-2-10 MG/5ML PO SYRP: 5.0000 mL | ORAL_SOLUTION | Freq: Four times a day (QID) | ORAL | 0 refills | Status: DC | PRN

## 2024-07-04 NOTE — Progress Notes (Signed)
" ° °  Thank you for the details you included in the comment boxes. Those details are very helpful in determining the best course of treatment for you and help us  to provide the best care.Because you have asthma, and are experiencing some wheezing-it would be best to make sure that we can visualize you to assure that you are breathing well and do not need to be seen in person, we recommend that you schedule a Virtual Urgent Care video visit in order for the provider to better assess what is going on.  The provider will be able to give you a more accurate diagnosis and treatment plan if we can more freely discuss your symptoms and with the addition of a virtual examination.   If you change your visit to a video visit, we will bill your insurance (similar to an office visit) and you will not be charged for this e-Visit. You will be able to stay at home and speak with the first available [facility] advanced practice provider. The link to do a video visit is in the drop down Menu tab of your Welcome screen in MyChart. "

## 2024-07-04 NOTE — Progress Notes (Signed)
 " Virtual Visit Consent   Angelica Crosby, you are scheduled for a virtual visit with a [facility] provider today. Just as with appointments in the office, your consent must be obtained to participate. Your consent will be active for this visit and any virtual visit you may have with one of our providers in the next 365 days. If you have a MyChart account, a copy of this consent can be sent to you electronically.  As this is a virtual visit, video technology does not allow for your provider to perform a traditional examination. This may limit your provider's ability to fully assess your condition. If your provider identifies any concerns that need to be evaluated in person or the need to arrange testing (such as labs, EKG, etc.), we will make arrangements to do so. Although advances in technology are sophisticated, we cannot ensure that it will always work on either your end or our end. If the connection with a video visit is poor, the visit may have to be switched to a telephone visit. With either a video or telephone visit, we are not always able to ensure that we have a secure connection.  By engaging in this virtual visit, you consent to the provision of healthcare and authorize for your insurance to be billed (if applicable) for the services provided during this visit. Depending on your insurance coverage, you may receive a charge related to this service.  I need to obtain your verbal consent now. Are you willing to proceed with your visit today? Angelica Crosby has provided verbal consent on 07/04/2024 for a virtual visit (video or telephone). Delon [name], PA-C  Date: 07/04/2024 10:27 AM   Virtual Visit via Video Note   I, Delon [name], connected with  Angelica Crosby  ([id number], 01/28/1991) on 07/04/24 at 10:15 AM EST by a video-enabled telemedicine application and verified that I am speaking with the correct person using two identifiers.  Location: Patient: Virtual Visit  Location Patient: Home Provider: Virtual Visit Location Provider: Home Office   I discussed the limitations of evaluation and management by telemedicine and the availability of in person appointments. The patient expressed understanding and agreed to proceed.    History of Present Illness: Angelica Crosby is a 34 y.o. who identifies as a female who was assigned female at birth, and is being seen today for URI with asthma.  HPI: URI  This is a new problem. The current episode started in the past 7 days (Sunday night, 06/30/24, started with an asthma attack). The problem has been gradually improving. There has been no fever. Associated symptoms include congestion, coughing, headaches, rhinorrhea, sinus pain, a sore throat and wheezing. Pertinent negatives include no diarrhea, ear pain, nausea, plugged ear sensation or vomiting. She has tried inhaler use for the symptoms.   At home Covid + Flu test is negative.   Problems:  Patient Active Problem List   Diagnosis Date Noted   Morbid obesity (HCC) 11/23/2022   Vaginal discharge 11/23/2022   Vitamin D  deficiency 03/29/2022   Sickle cell trait    Fatigue 11/23/2018   Irritable bowel 11/23/2018   Allergic rhinitis 11/19/2014   Mild intermittent asthma 11/19/2014    Allergies: Allergies[1] Medications: Current Medications[2]  Observations/Objective: Patient is well-developed, well-nourished in no acute distress.  Resting comfortably at home.  Head is normocephalic, atraumatic.  No labored breathing.  Speech is clear and coherent with logical content.  Patient is alert and oriented at baseline.  Assessment and Plan: 1. Viral URI with cough (Primary) - predniSONE  (DELTASONE ) 20 MG tablet; Take 2 tablets (40 mg total) by mouth daily with breakfast.  Dispense: 14 tablet; Refill: 0 - brompheniramine-pseudoephedrine-DM 30-2-10 MG/5ML syrup; Take 5 mLs by mouth 4 (four) times daily as needed.  Dispense: 120 mL; Refill: 0  2. Moderate  persistent asthma with exacerbation - predniSONE  (DELTASONE ) 20 MG tablet; Take 2 tablets (40 mg total) by mouth daily with breakfast.  Dispense: 14 tablet; Refill: 0 - brompheniramine-pseudoephedrine-DM 30-2-10 MG/5ML syrup; Take 5 mLs by mouth 4 (four) times daily as needed.  Dispense: 120 mL; Refill: 0  - Suspect viral URI causing asthma exacerbation - Symptomatic medications of choice over the counter as needed - Added Prednisone  for asthma flare - Added Bromfed DM for cough and congestion - Advised to restart Ryaltris  nasal spray using as directed - Continue inhalers as needed and prescribed - Push fluids - Rest - Seek further evaluation if symptoms change or worsen   Follow Up Instructions: I discussed the assessment and treatment plan with the patient. The patient was provided an opportunity to ask questions and all were answered. The patient agreed with the plan and demonstrated an understanding of the instructions.  A copy of instructions were sent to the patient via MyChart unless otherwise noted below.    The patient was advised to call back or seek an in-person evaluation if the symptoms worsen or if the condition fails to improve as anticipated.    Delon [name], PA-C     [1]  Allergies Allergen Reactions   Phentermine  Hcl Hives   Amoxicillin Hives and Itching   Iodinated Contrast Media Nausea And Vomiting   Other Other (See Comments)    Strawberries cause discoloration of tongue and pineapple juice cause a tingling sensation in the lips, also allergic to pollenated foods, trees, grass and dogs   Penicillins Hives and Itching    Has patient had a PCN reaction causing immediate rash, facial/tongue/throat swelling, SOB or lightheadedness with hypotension: no Has patient had a PCN reaction causing severe rash involving mucus membranes or skin necrosis: No Has patient had a PCN reaction that required hospitalization: no  Has patient had a PCN reaction occurring  within the last 10 years: yes If all of the above answers are NO, then may proceed with Cephalosporin use.  [2]  Current Outpatient Medications:    brompheniramine-pseudoephedrine-DM 30-2-10 MG/5ML syrup, Take 5 mLs by mouth 4 (four) times daily as needed., Disp: 120 mL, Rfl: 0   predniSONE  (DELTASONE ) 20 MG tablet, Take 2 tablets (40 mg total) by mouth daily with breakfast., Disp: 14 tablet, Rfl: 0   albuterol  (VENTOLIN  HFA) 108 (90 Base) MCG/ACT inhaler, Inhale 2 puffs into the lungs as needed for wheezing or shortness of breath., Disp: 18 g, Rfl: 1   Ascorbic Acid (VITAMIN C) 1000 MG tablet, 1 tablet, Disp: , Rfl:    cholecalciferol (VITAMIN D3) 25 MCG (1000 UNIT) tablet, 1 tablet, Disp: , Rfl:    diphenhydrAMINE (BENADRYL) 25 MG tablet, 1 tablet at bedtime as needed, Disp: , Rfl:    fluconazole  (DIFLUCAN ) 150 MG tablet, Take 1 tablet (150 mg total) by mouth every three (3) days as needed., Disp: 2 tablet, Rfl: 0   fluticasone  (FLOVENT  HFA) 110 MCG/ACT inhaler, Inhale 2 puffs into the lungs in the morning and at bedtime., Disp: 36 g, Rfl: 1   levocetirizine (XYZAL ) 5 MG tablet, TAKE 1 TABLET(5 MG) BY MOUTH EVERY EVENING, Disp:  30 tablet, Rfl: 5   metroNIDAZOLE  (METROGEL ) 0.75 % vaginal gel, Place 1 Applicatorful vaginally at bedtime., Disp: 70 g, Rfl: 0   Olopatadine-Mometasone (RYALTRIS ) 665-25 MCG/ACT SUSP, Place 2 sprays per nostril 1-2 times daily as needed for nasal congestion or drainage contro, Disp: 29 g, Rfl: 5   Omeprazole 20 MG TBDD, 1 capsule 30 minutes before morning meal, Disp: , Rfl:   "

## 2024-07-04 NOTE — Patient Instructions (Signed)
 " Angelica Crosby, thank you for joining Delon [name], PA-C for today's virtual visit.  While this provider is not your primary care provider (PCP), if your PCP is located in our provider database this encounter information will be shared with them immediately following your visit.   A [facility] MyChart account gives you access to today's visit and all your visits, tests, and labs performed at [facility]  click here if you don't have a [facility] MyChart account or go to [email or web address]  Consent: (Patient) Angelica Crosby provided verbal consent for this virtual visit at the beginning of the encounter.  Current Medications:  Current Outpatient Medications:    brompheniramine-pseudoephedrine-DM 30-2-10 MG/5ML syrup, Take 5 mLs by mouth 4 (four) times daily as needed., Disp: 120 mL, Rfl: 0   predniSONE  (DELTASONE ) 20 MG tablet, Take 2 tablets (40 mg total) by mouth daily with breakfast., Disp: 14 tablet, Rfl: 0   albuterol  (VENTOLIN  HFA) 108 (90 Base) MCG/ACT inhaler, Inhale 2 puffs into the lungs as needed for wheezing or shortness of breath., Disp: 18 g, Rfl: 1   Ascorbic Acid (VITAMIN C) 1000 MG tablet, 1 tablet, Disp: , Rfl:    cholecalciferol (VITAMIN D3) 25 MCG (1000 UNIT) tablet, 1 tablet, Disp: , Rfl:    diphenhydrAMINE (BENADRYL) 25 MG tablet, 1 tablet at bedtime as needed, Disp: , Rfl:    fluconazole  (DIFLUCAN ) 150 MG tablet, Take 1 tablet (150 mg total) by mouth every three (3) days as needed., Disp: 2 tablet, Rfl: 0   fluticasone  (FLOVENT  HFA) 110 MCG/ACT inhaler, Inhale 2 puffs into the lungs in the morning and at bedtime., Disp: 36 g, Rfl: 1   levocetirizine (XYZAL ) 5 MG tablet, TAKE 1 TABLET(5 MG) BY MOUTH EVERY EVENING, Disp: 30 tablet, Rfl: 5   metroNIDAZOLE  (METROGEL ) 0.75 % vaginal gel, Place 1 Applicatorful vaginally at bedtime., Disp: 70 g, Rfl: 0   Olopatadine-Mometasone (RYALTRIS ) 665-25 MCG/ACT SUSP, Place 2 sprays per nostril 1-2 times  daily as needed for nasal congestion or drainage contro, Disp: 29 g, Rfl: 5   Omeprazole 20 MG TBDD, 1 capsule 30 minutes before morning meal, Disp: , Rfl:    Medications ordered in this encounter:  Meds ordered this encounter  Medications   predniSONE  (DELTASONE ) 20 MG tablet    Sig: Take 2 tablets (40 mg total) by mouth daily with breakfast.    Dispense:  14 tablet    Refill:  0    Supervising Provider:   BLAISE ALEENE KIDD [[id number]]   brompheniramine-pseudoephedrine-DM 30-2-10 MG/5ML syrup    Sig: Take 5 mLs by mouth 4 (four) times daily as needed.    Dispense:  120 mL    Refill:  0    Supervising Provider:   BLAISE ALEENE KIDD [[id number]]     *If you need refills on other medications prior to your next appointment, please contact your pharmacy*  Follow-Up: Call back or seek an in-person evaluation if the symptoms worsen or if the condition fails to improve as anticipated.  [facility] [phone]  Other Instructions Viral Respiratory Infection A respiratory infection is an illness that affects part of the respiratory system, such as the lungs, nose, or throat. A respiratory infection that is caused by a virus is called a viral respiratory infection. Common types of viral respiratory infections include: A cold. The flu (influenza). A respiratory syncytial virus (RSV) infection. What are the causes? This condition is caused by a virus. The virus may  spread through contact with droplets or direct contact with infected people or their mucus or secretions. The virus may spread from person to person (is contagious). What are the signs or symptoms? Symptoms of this condition include: A stuffy or runny nose. A sore throat or cough. Shortness of breath or difficulty breathing. Yellow or green mucus (sputum). Other symptoms may include: A fever. Sweating or chills. Fatigue. Achy muscles. A headache. How is this diagnosed? This condition may be diagnosed based  on: Your symptoms. A physical exam. Testing of secretions from the nose or throat. Chest X-ray. How is this treated? This condition may be treated with medicines, such as: Antiviral medicine. This may shorten the length of time a person has symptoms. Expectorants. These make it easier to cough up mucus. Decongestant nasal sprays. Acetaminophen or NSAIDs, such as ibuprofen , to relieve fever and pain. Antibiotic medicines are not prescribed for viral infections.This is because antibiotics are designed to kill bacteria. They do not kill viruses. Follow these instructions at home: Managing pain and congestion Take over-the-counter and prescription medicines only as told by your health care provider. If you have a sore throat, gargle with a mixture of salt and water 3-4 times a day or as needed. To make salt water, completely dissolve -1 tsp (3-6 g) of salt in 1 cup (237 mL) of warm water. Use nose drops made from salt water to ease congestion and soften raw skin around your nose. Take 2 tsp (10 mL) of honey at bedtime to lessen coughing at night. Do not give honey to children who are younger than 1 year. Drink enough fluid to keep your urine pale yellow. This helps prevent dehydration and helps loosen up mucus. General instructions  Rest as much as possible. Do not drink alcohol. Do not use any products that contain nicotine or tobacco. These products include cigarettes, chewing tobacco, and vaping devices, such as e-cigarettes. If you need help quitting, ask your health care provider. Keep all follow-up visits. This is important. How is this prevented?     Get an annual flu shot. You may get the flu shot in late summer, fall, or winter. Ask your health care provider when you should get your flu shot. Avoid spreading your infection to other people. If you are sick: Wash your hands with soap and water often, especially after you cough or sneeze. Wash for at least 20 seconds. If soap and  water are not available, use alcohol-based hand sanitizer. Cover your mouth when you cough. Cover your nose and mouth when you sneeze. Do not share cups or eating utensils. Clean commonly used objects often. Clean commonly touched surfaces. Stay home from work or school as told by your health care provider. Avoid contact with people who are sick during cold and flu season. This is generally fall and winter. Contact a health care provider if: Your symptoms last for 10 days or longer. Your symptoms get worse over time. You have severe sinus pain in your face or forehead. The glands in your jaw or neck become very swollen. You have shortness of breath. Get help right away if you: Feel pain or pressure in your chest. Have trouble breathing. Faint or feel like you will faint. Have severe and persistent vomiting. Feel confused or disoriented. These symptoms may represent a serious problem that is an emergency. Do not wait to see if the symptoms will go away. Get medical help right away. Call your local emergency services (911 in the [place] Do not  drive yourself to the hospital. Summary A respiratory infection is an illness that affects part of the respiratory system, such as the lungs, nose, or throat. A respiratory infection that is caused by a virus is called a viral respiratory infection. Common types of viral respiratory infections include a cold, influenza, and respiratory syncytial virus (RSV) infection. Symptoms of this condition include a stuffy or runny nose, cough, fatigue, achy muscles, sore throat, and fevers or chills. Antibiotic medicines are not prescribed for viral infections. This is because antibiotics are designed to kill bacteria. They are not effective against viruses. This information is not intended to replace advice given to you by your health care provider. Make sure you discuss any questions you have with your health care provider. Document Revised: 09/10/2020 Document  Reviewed: 09/10/2020 Elsevier Patient Education  2024 Elsevier Inc.   If you have been instructed to have an in-person evaluation today at a local Urgent Care facility, please use the link below. It will take you to a list of all of our available [facility], including address, phone number and hours of operation. Please do not delay care.  [facility]  If you or a family member do not have a primary care provider, use the link below to schedule a visit and establish care. When you choose a [facility] primary care physician or advanced practice provider, you gain a long-term partner in health. Find a Primary Care Provider  Learn more about [facility] in-office and virtual care options: [facility] - Get Care Now "

## 2024-07-18 ENCOUNTER — Encounter: Payer: Self-pay | Admitting: Family Medicine

## 2024-07-18 ENCOUNTER — Telehealth: Payer: Self-pay | Admitting: Family Medicine

## 2024-07-18 ENCOUNTER — Other Ambulatory Visit (HOSPITAL_COMMUNITY)
Admission: RE | Admit: 2024-07-18 | Discharge: 2024-07-18 | Disposition: A | Source: Ambulatory Visit | Attending: Family Medicine | Admitting: Family Medicine

## 2024-07-18 ENCOUNTER — Ambulatory Visit (INDEPENDENT_AMBULATORY_CARE_PROVIDER_SITE_OTHER): Admitting: Family Medicine

## 2024-07-18 VITALS — BP 128/88 | HR 70 | Temp 98.2°F | Ht 63.0 in | Wt 295.3 lb

## 2024-07-18 DIAGNOSIS — F902 Attention-deficit hyperactivity disorder, combined type: Secondary | ICD-10-CM

## 2024-07-18 DIAGNOSIS — R400 Somnolence: Secondary | ICD-10-CM

## 2024-07-18 DIAGNOSIS — N898 Other specified noninflammatory disorders of vagina: Secondary | ICD-10-CM | POA: Insufficient documentation

## 2024-07-18 DIAGNOSIS — J4520 Mild intermittent asthma, uncomplicated: Secondary | ICD-10-CM | POA: Diagnosis not present

## 2024-07-18 MED ORDER — FLUTICASONE-SALMETEROL 115-21 MCG/ACT IN AERO: 2.0000 | INHALATION_SPRAY | Freq: Two times a day (BID) | RESPIRATORY_TRACT | 2 refills | Status: AC

## 2024-07-18 MED ORDER — BUPROPION HCL ER (SR) 100 MG PO TB12: 100.0000 mg | ORAL_TABLET | Freq: Every morning | ORAL | 2 refills | Status: AC

## 2024-07-18 MED ORDER — MONTELUKAST SODIUM 10 MG PO TABS: 10.0000 mg | ORAL_TABLET | Freq: Every day | ORAL | 3 refills | Status: AC

## 2024-07-18 NOTE — Telephone Encounter (Signed)
 Error/njr

## 2024-07-18 NOTE — Progress Notes (Signed)
 "  Established Patient Office Visit  Subjective   Patient ID: Angelica Crosby, female    DOB: 29-Nov-1990  Age: 34 y.o. MRN: [id number]  Chief Complaint  Patient presents with   Annual Exam   Vaginal Discharge    Patient complains of brown discharge and abnormal odor x2 months   Cough    Productive with green-yellow sputum x2 weeks, states she had an E-visit due to these symptoms and asthma ,given Prednisone     ADHD    Patient states she was diagnosed with ADHD by behavioral health and requests to start medication    Vaginal Discharge The patient's primary symptoms include vaginal discharge.  Cough   Discussed the use of AI scribe software for clinical note transcription with the patient, who gave verbal consent to proceed.  History of Present Illness   Angelica Crosby is a 34 year old female with asthma who presents with persistent cough and recurrent infections.  She has had a persistent cough for three weeks with an asthma attack last Sunday. She used her inhaler without a spacer with only temporary relief. She wakes with sore throat and a sensation of something stuck in her throat and has yellow-green sputum. She took Mucinex and was prescribed prednisone  and cough syrup via virtual visit, but symptoms persist. She feels wheezy without shortness of breath. She uses Flovent  and a nasal spray but is unsure of which inhaler is active or the correct dosing, alternating between a red and an orange inhaler. She also takes Xyzal  5 mg daily and omeprazole as needed.  She reports recurrent bacterial vaginosis and yeast infections. She alternates unscented [employer] and [employer] daily. She has been sexually active with different partners, and her partner has not been treated. Symptoms resolved for three weeks after prior treatment but then recurred. She has previously tested positive for both bacterial vaginosis and Candida.  She was diagnosed with ADHD by [facility]  after formal testing but has not started ADHD medication.  She has anxiety and depression with scores of eight and seven. She has difficulty staying asleep and wakes up tired, saying her body sleeps but her mind does not. Her mother has sleep apnea, but she does not think she stops breathing at night. She goes to bed around 9:30 PM and wakes at 6:20 AM but is up and down through the night.       Current Outpatient Medications  Medication Instructions   albuterol  (VENTOLIN  HFA) 108 (90 Base) MCG/ACT inhaler 2 puffs, Inhalation, As needed   buPROPion  ER (WELLBUTRIN  SR) 100 mg, Oral, Every morning   diphenhydrAMINE (BENADRYL) 25 MG tablet 1 tablet at bedtime as needed   fluticasone -salmeterol (ADVAIR HFA) 115-21 MCG/ACT inhaler 2 puffs, Inhalation, 2 times daily   levocetirizine (XYZAL ) 5 MG tablet TAKE 1 TABLET(5 MG) BY MOUTH EVERY EVENING   montelukast  (SINGULAIR ) 10 mg, Oral, Daily at bedtime   Olopatadine-Mometasone (RYALTRIS ) 665-25 MCG/ACT SUSP Place 2 sprays per nostril 1-2 times daily as needed for nasal congestion or drainage contro   Omeprazole 20 MG TBDD 1 capsule 30 minutes before morning meal    Patient Active Problem List   Diagnosis Date Noted   Morbid obesity (HCC) 11/23/2022   Vaginal discharge 11/23/2022   Vitamin D  deficiency 03/29/2022   Sickle cell trait    Fatigue 11/23/2018   Irritable bowel 11/23/2018   Allergic rhinitis 11/19/2014   Mild intermittent asthma 11/19/2014     Review of Systems  Respiratory:  Positive for cough.   Genitourinary:  Positive for vaginal discharge.  All other systems reviewed and are negative.     Objective:     BP 128/88   Pulse 70   Temp 98.2 F (36.8 C) (Oral)   Ht 5' 3 (1.6 m)   Wt 295 lb 4.8 oz (133.9 kg)   LMP 07/13/2024 (Exact Date)   SpO2 97%   BMI 52.31 kg/m    Physical Exam Vitals reviewed.  Constitutional:      Appearance: Normal appearance. She is morbidly obese.  HENT:     Right Ear: Tympanic  membrane normal.     Left Ear: Tympanic membrane normal.     Nose: Nose normal. No congestion.     Mouth/Throat:     Mouth: Mucous membranes are moist.     Pharynx: No posterior oropharyngeal erythema.  Eyes:     Conjunctiva/sclera: Conjunctivae normal.  Cardiovascular:     Rate and Rhythm: Normal rate and regular rhythm.     Heart sounds: Normal heart sounds.  Pulmonary:     Effort: Pulmonary effort is normal.     Breath sounds: Normal breath sounds. No wheezing, rhonchi or rales.  Neurological:     Mental Status: She is alert.      No results found for any visits on 07/18/24.    The ASCVD Risk score (Arnett DK, et al., 2019) failed to calculate for the following reasons:   The 2019 ASCVD risk score is only valid for ages 60 to 45    Assessment & Plan:  Vaginal discharge -     Cervicovaginal ancillary only  Daytime somnolence -     Ambulatory referral to Sleep Studies  Attention deficit hyperactivity disorder (ADHD), combined type -     buPROPion  HCl ER (SR); Take 1 tablet (100 mg total) by mouth in the morning.  Dispense: 30 tablet; Refill: 2  Mild intermittent asthma without complication -     Fluticasone -Salmeterol; Inhale 2 puffs into the lungs 2 (two) times daily.  Dispense: 1 each; Refill: 2 -     Montelukast  Sodium; Take 1 tablet (10 mg total) by mouth at bedtime.  Dispense: 30 tablet; Refill: 3   Assessment and Plan    Recurrent bacterial vaginosis and vulvovaginal candidiasis Recurrent bacterial vaginosis with resolution of symptoms for three weeks post-treatment. Concurrent vulvovaginal candidiasis confirmed by positive Candida swabs. Consideration of partner treatment to prevent reinfection. Discussion of suppressive therapy with Flagyl  for BV, but no suppressive therapy available for Candida. - Performed vaginal swab to confirm diagnosis - Will consider partner treatment for BV - Will consider weekly Flagyl  for BV suppression - Will treat Candida  infection as needed  Asthma with acute exacerbation Asthma exacerbation 3 weeks ago with wheezing and persistent cough for three weeks. Previous treatment with prednisone  and cough syrup was ineffective. No shortness of breath reported. Current inhaler regimen includes Flovent  and nasal spray. Consideration of changing inhaler to Advair for better symptom control. Discussion of potential need for nebulizer and daily maintenance inhaler. - Changed Flovent  to Advair for better symptom control - Added Singulair  to help with mucus - Continue nasal spray and antihistamine (Xyzal ) - Will consider nebulizer if symptoms persist  Attention-deficit hyperactivity disorder, combined type Diagnosed with ADHD by Behavioral Health. No specific testing records available. Discussion of treatment options including stimulant and non-stimulant medications. Wellbutrin  chosen as a non-stimulant option to manage ADHD symptoms and mild depression. - Started Wellbutrin  100 mg once daily in the  morning - Will obtain ADHD testing records for further treatment options  Suspected obstructive sleep apnea Symptoms of poor sleep quality, daytime fatigue, and family history of sleep apnea. No previous evaluation for sleep apnea. Discussion of potential benefits of sleep study to confirm diagnosis and explore treatment options, including Zepbound for sleep apnea. - Ordered sleep study to evaluate for obstructive sleep apnea - Will consider Zepbound for sleep apnea if confirmed  Depression and anxiety symptoms Mild depression and anxiety symptoms with a score of 8 for depression and 7 for anxiety. Symptoms may be related to ADHD or suspected sleep apnea. Wellbutrin  chosen to address both ADHD and mild depression symptoms. - Started Wellbutrin  100 mg once daily in the morning        Return in about 2 months (around 09/15/2024) for annual physical exam.    Heron CHRISTELLA Sharper, MD  "

## 2024-07-18 NOTE — Telephone Encounter (Signed)
 Copied from CRM [phone]. Topic: General - Call Back - No Documentation >> Jul 18, 2024  2:14 PM Alfonso ORN wrote: Reason for CRM: pt requested to speak to [facility] as she was advised to callback once she figures out what provider for office to contact regarding her paperwork. Pt provided : [place] [facility] - dr trevor t barker  . Please call pt back to f/u

## 2024-07-19 ENCOUNTER — Ambulatory Visit: Payer: Self-pay | Admitting: Family Medicine

## 2024-07-19 ENCOUNTER — Other Ambulatory Visit: Payer: Self-pay

## 2024-07-19 DIAGNOSIS — B3731 Acute candidiasis of vulva and vagina: Secondary | ICD-10-CM

## 2024-07-19 DIAGNOSIS — B9689 Other specified bacterial agents as the cause of diseases classified elsewhere: Secondary | ICD-10-CM

## 2024-07-19 LAB — CERVICOVAGINAL ANCILLARY ONLY
Bacterial Vaginitis (gardnerella): POSITIVE — AB
Candida Glabrata: NEGATIVE
Candida Vaginitis: POSITIVE — AB
Chlamydia: NEGATIVE
Comment: NEGATIVE
Comment: NEGATIVE
Comment: NEGATIVE
Comment: NEGATIVE
Comment: NEGATIVE
Comment: NORMAL
Neisseria Gonorrhea: NEGATIVE
Trichomonas: NEGATIVE

## 2024-07-19 MED ORDER — METRONIDAZOLE 0.75 % VA GEL: 1.0000 | VAGINAL | 5 refills | Status: AC

## 2024-07-19 MED ORDER — METRONIDAZOLE 500 MG PO TABS: 500.0000 mg | ORAL_TABLET | Freq: Two times a day (BID) | ORAL | 0 refills | Status: AC

## 2024-07-19 MED ORDER — FLUCONAZOLE 150 MG PO TABS: 150.0000 mg | ORAL_TABLET | ORAL | 0 refills | Status: AC | PRN

## 2024-07-19 NOTE — Telephone Encounter (Signed)
 It still says sensitive note even though I put in my password. Please have patient come in and sign a medical record release form so I can get her ADHD testing and review it.

## 2024-07-19 NOTE — Telephone Encounter (Signed)
 Per crm pt said she will be her on Monday to sign medical release form

## 2024-07-19 NOTE — Telephone Encounter (Signed)
 Unable to leave a message due to voicemail being full.

## 2024-09-19 ENCOUNTER — Encounter: Admitting: Family Medicine
